# Patient Record
Sex: Female | Born: 1950
Health system: Southern US, Community
[De-identification: ages and names within clinical notes are randomized; demographics above are authoritative.]

## PROBLEM LIST (undated history)

## (undated) DIAGNOSIS — C4491 Basal cell carcinoma of skin, unspecified: Secondary | ICD-10-CM

## (undated) DIAGNOSIS — Z8669 Personal history of other diseases of the nervous system and sense organs: Secondary | ICD-10-CM

## (undated) DIAGNOSIS — R519 Headache, unspecified: Secondary | ICD-10-CM

## (undated) DIAGNOSIS — E039 Hypothyroidism, unspecified: Secondary | ICD-10-CM

## (undated) DIAGNOSIS — M1990 Unspecified osteoarthritis, unspecified site: Secondary | ICD-10-CM

## (undated) DIAGNOSIS — G709 Myoneural disorder, unspecified: Secondary | ICD-10-CM

## (undated) DIAGNOSIS — G47419 Narcolepsy without cataplexy: Secondary | ICD-10-CM

## (undated) DIAGNOSIS — G4710 Hypersomnia, unspecified: Secondary | ICD-10-CM

## (undated) DIAGNOSIS — G20 Parkinson's disease: Secondary | ICD-10-CM

## (undated) DIAGNOSIS — G20A1 Parkinson's disease without dyskinesia, without mention of fluctuations: Secondary | ICD-10-CM

## (undated) DIAGNOSIS — R400 Somnolence: Secondary | ICD-10-CM

## (undated) HISTORY — DX: Somnolence: R40.0

## (undated) HISTORY — DX: Basal cell carcinoma of skin, unspecified: C44.91

## (undated) HISTORY — DX: Hypersomnia, unspecified: G47.10

## (undated) HISTORY — DX: Narcolepsy without cataplexy: G47.419

## (undated) HISTORY — DX: Parkinson's disease without dyskinesia, without mention of fluctuations: G20.A1

## (undated) HISTORY — DX: Hypothyroidism, unspecified: E03.9

## (undated) HISTORY — DX: Parkinson's disease: G20

## (undated) HISTORY — PX: CATARACT EXTRACTION: SUR2

## (undated) HISTORY — DX: Personal history of other diseases of the nervous system and sense organs: Z86.69

---

## 1999-06-05 ENCOUNTER — Other Ambulatory Visit: Admission: RE | Admit: 1999-06-05 | Discharge: 1999-06-05 | Payer: Self-pay | Admitting: Obstetrics and Gynecology

## 2000-08-07 ENCOUNTER — Encounter: Payer: Self-pay | Admitting: Obstetrics and Gynecology

## 2000-08-07 ENCOUNTER — Encounter: Admission: RE | Admit: 2000-08-07 | Discharge: 2000-08-07 | Payer: Self-pay | Admitting: Obstetrics and Gynecology

## 2000-08-08 ENCOUNTER — Other Ambulatory Visit: Admission: RE | Admit: 2000-08-08 | Discharge: 2000-08-08 | Payer: Self-pay | Admitting: Obstetrics and Gynecology

## 2001-11-25 ENCOUNTER — Encounter: Admission: RE | Admit: 2001-11-25 | Discharge: 2001-11-25 | Payer: Self-pay | Admitting: Family Medicine

## 2001-11-25 ENCOUNTER — Other Ambulatory Visit: Admission: RE | Admit: 2001-11-25 | Discharge: 2001-11-25 | Payer: Self-pay | Admitting: *Deleted

## 2001-11-25 ENCOUNTER — Encounter: Payer: Self-pay | Admitting: Obstetrics and Gynecology

## 2003-06-15 ENCOUNTER — Encounter: Payer: Self-pay | Admitting: Obstetrics and Gynecology

## 2003-06-15 ENCOUNTER — Encounter: Admission: RE | Admit: 2003-06-15 | Discharge: 2003-06-15 | Payer: Self-pay | Admitting: Obstetrics and Gynecology

## 2004-07-04 ENCOUNTER — Other Ambulatory Visit: Admission: RE | Admit: 2004-07-04 | Discharge: 2004-07-04 | Payer: Self-pay | Admitting: Obstetrics and Gynecology

## 2004-07-18 ENCOUNTER — Ambulatory Visit (HOSPITAL_COMMUNITY): Admission: RE | Admit: 2004-07-18 | Discharge: 2004-07-18 | Payer: Self-pay | Admitting: Obstetrics and Gynecology

## 2005-09-07 ENCOUNTER — Other Ambulatory Visit: Admission: RE | Admit: 2005-09-07 | Discharge: 2005-09-07 | Payer: Self-pay | Admitting: Obstetrics and Gynecology

## 2005-09-26 ENCOUNTER — Ambulatory Visit (HOSPITAL_COMMUNITY): Admission: RE | Admit: 2005-09-26 | Discharge: 2005-09-26 | Payer: Self-pay | Admitting: Obstetrics and Gynecology

## 2006-10-21 ENCOUNTER — Other Ambulatory Visit: Admission: RE | Admit: 2006-10-21 | Discharge: 2006-10-21 | Payer: Self-pay | Admitting: Obstetrics & Gynecology

## 2006-10-23 ENCOUNTER — Ambulatory Visit (HOSPITAL_COMMUNITY): Admission: RE | Admit: 2006-10-23 | Discharge: 2006-10-23 | Payer: Self-pay | Admitting: Obstetrics and Gynecology

## 2007-12-08 ENCOUNTER — Ambulatory Visit (HOSPITAL_COMMUNITY): Admission: RE | Admit: 2007-12-08 | Discharge: 2007-12-08 | Payer: Self-pay | Admitting: Obstetrics and Gynecology

## 2008-01-05 ENCOUNTER — Other Ambulatory Visit: Admission: RE | Admit: 2008-01-05 | Discharge: 2008-01-05 | Payer: Self-pay | Admitting: Obstetrics and Gynecology

## 2008-01-26 ENCOUNTER — Encounter: Admission: RE | Admit: 2008-01-26 | Discharge: 2008-01-26 | Payer: Self-pay | Admitting: Obstetrics & Gynecology

## 2008-12-23 ENCOUNTER — Ambulatory Visit (HOSPITAL_COMMUNITY): Admission: RE | Admit: 2008-12-23 | Discharge: 2008-12-23 | Payer: Self-pay | Admitting: Obstetrics & Gynecology

## 2009-01-11 ENCOUNTER — Other Ambulatory Visit: Admission: RE | Admit: 2009-01-11 | Discharge: 2009-01-11 | Payer: Self-pay | Admitting: Obstetrics and Gynecology

## 2009-10-04 ENCOUNTER — Ambulatory Visit: Payer: Self-pay | Admitting: Sports Medicine

## 2009-10-04 DIAGNOSIS — M7750 Other enthesopathy of unspecified foot: Secondary | ICD-10-CM | POA: Insufficient documentation

## 2009-10-04 DIAGNOSIS — M79609 Pain in unspecified limb: Secondary | ICD-10-CM | POA: Insufficient documentation

## 2010-01-19 ENCOUNTER — Ambulatory Visit (HOSPITAL_COMMUNITY): Admission: RE | Admit: 2010-01-19 | Discharge: 2010-01-19 | Payer: Self-pay | Admitting: Obstetrics & Gynecology

## 2010-12-14 ENCOUNTER — Ambulatory Visit
Admission: RE | Admit: 2010-12-14 | Discharge: 2010-12-14 | Payer: Self-pay | Source: Home / Self Care | Attending: Sports Medicine | Admitting: Sports Medicine

## 2010-12-21 NOTE — Assessment & Plan Note (Signed)
Summary: ORTHOTICS PER [facility]   Vital Signs:  Patient profile:   60 year old female Pulse rate:   70 / minute BP sitting:   118 / 82  (right arm)  Vitals Entered By: Rochele Pages RN (December 14, 2010 10:11 AM) CC: orthotics   CC:  orthotics.  History of Present Illness: 60yo female to office for custom orthotics.  Hx of metatarsalgia, been having increasing pain in left foot.  Denies any swelling.  Occasional numbness/tingling on top of foot.  Wearing Sports Insoles with MT pad on left insole - was helpful initially, but not as helpful now that worn down.  She is active with aerobics, hiking, & walking on a regular basis.    Preventive Screening-Counseling & Management  Alcohol-Tobacco     Smoking Status: never  Allergies: No Known Drug Allergies  Social History: Smoking Status:  never  Review of Systems       per HPI  Physical Exam  General:  Well-developed,well-nourished,in no acute distress; alert,appropriate and cooperative throughout examination Msk:  HIPS: FROM b/l without pain.  Weakness on hip abduction bilat L > R, otherwise good strength.  ANKLES: FROM b/l without pain, weakness, or laxity.  FEET: cavus foot b/l, mild transverse arch collapse on left.  Callous noted at MT heads 2-4 on left, no significant callous on right.  TTP over MT heads 2-4 on left.  No tenderness on the right.  GAIT: no leg length difference.  no limp, noted to have wobble of hips & knees - more wobble on the left. Pulses:  +2/4 DP & PT b/l Neurologic:  sensation intact to light touch.     Impression & Recommendations:  Problem # 1:  FOOT PAIN, LEFT (ICD-729.5)  - L foot pain seconary to metatarsalgia - Fitted with custom orthotics in office today Patient was fitted for a : standard, cushioned, semi-rigid orthotic. The orthotic was heated and afterward the patient stood on the orthotic blank positioned on the orthotic stand. The patient was positioned in subtalar neutral  position and 10 degrees of ankle dorsiflexion in a weight bearing stance. After completion of molding, a stable base was applied to the orthotic blank. The blank was ground to a stable position for weight bearing. Size: 7 - blue swirl Base: blue EVA Posting: none Additional orthotic padding: MT pad on left insole Orthotics comfortable in office today & gait was neutral with orthotics 45-mins spent with orthotic preparation - Wear insoles in shoes for aerobics & hiking - Shown exercises to help improve hip strength - f/u as needed  Orders: Orthotic Materials, each unit (L3002) Metatarsal Pads (F6213)  Problem # 2:  METATARSALGIA (ICD-726.70)  - Metarsalgia of left foot.  Fitted with custom orthotics as stated above - MT pads on left sports insole replaced with new ones today - f/u as needed   Orders: Orthotic Materials, each unit (L3002) Metatarsal Pads (Y8657)   Orders Added: 1)  Est. Patient Level IV [84696] 2)  Orthotic Materials, each unit [L3002] 3)  Metatarsal Pads [E9528]

## 2011-04-05 ENCOUNTER — Other Ambulatory Visit: Payer: Self-pay | Admitting: Obstetrics & Gynecology

## 2011-04-05 DIAGNOSIS — Z1231 Encounter for screening mammogram for malignant neoplasm of breast: Secondary | ICD-10-CM

## 2011-04-17 ENCOUNTER — Ambulatory Visit (HOSPITAL_COMMUNITY)
Admission: RE | Admit: 2011-04-17 | Discharge: 2011-04-17 | Disposition: A | Discharge status: Home / Self Care | Payer: PRIVATE HEALTH INSURANCE | Source: Ambulatory Visit | Attending: Obstetrics & Gynecology | Admitting: Obstetrics & Gynecology

## 2011-04-17 DIAGNOSIS — Z1231 Encounter for screening mammogram for malignant neoplasm of breast: Secondary | ICD-10-CM | POA: Insufficient documentation

## 2011-10-24 ENCOUNTER — Ambulatory Visit: Payer: PRIVATE HEALTH INSURANCE | Admitting: Sports Medicine

## 2011-10-25 ENCOUNTER — Encounter: Payer: Self-pay | Admitting: Sports Medicine

## 2011-10-25 ENCOUNTER — Ambulatory Visit (INDEPENDENT_AMBULATORY_CARE_PROVIDER_SITE_OTHER): Payer: PRIVATE HEALTH INSURANCE | Admitting: Sports Medicine

## 2011-10-25 VITALS — BP 113/80 | HR 67 | Ht 65.0 in | Wt 124.0 lb

## 2011-10-25 DIAGNOSIS — M7750 Other enthesopathy of unspecified foot: Secondary | ICD-10-CM

## 2011-10-25 DIAGNOSIS — M79609 Pain in unspecified limb: Secondary | ICD-10-CM

## 2011-10-25 NOTE — Patient Instructions (Signed)
Before performing balance exercises, stand on one foot with eyes closed for 15 seconds  Perform 2 sets(10 reps each) of hip abduction and rotation exercises daily  Hold dumbbells while walking across a room on your toes, and walk back on your heels 10 times daily  Do 15 calf raises with your leg straight, and 15 calf raises bent at the knee  Perform 5 cone touches (bend knees 20 degrees and rotate to touch cone) daily  Continue hip lifts as prescribed earlier  RTC as needed

## 2011-10-25 NOTE — Assessment & Plan Note (Signed)
With foot pain less she feels like she can do more strength work for her left leg  Work will focus on strength and balance  See if seh progresses well Reck with me prn

## 2011-10-25 NOTE — Progress Notes (Signed)
  Subjective:    Patient ID: Sarah Montoya, female    DOB: 1951/07/03, 60 y.o.   MRN: [id number]  HPI  Pt presents to clinic for evaluation of left leg weakness. Patient thinks this related to the compensating for the metatarsalgia she had on the lt side for years. Lt metatarsalgia has resolved with orthotic correction. Pt states she notices that her balance and coordination are not as good on lt, and sometimes she shuffles on the left when she walks.  Does 1 to 1.5 hrs exercise daily Aerobics and walking, yoga   Review of Systems     Objective:   Physical Exam GEN: alert, awake, no acute distress CHEST: symmetric chest expansion, normal WOB Skin: no apparent skin lesions or breakdown Musculoskeletal:  - Bilateral quad strength 5/5 - Bilateral hamstring strength 5/5 - Calf stregnth 5/5 on right side, 4/5 on left side  - Less dorsiflexion on heel walk on left  - Bilateral gluteus medius strength of 4/5        Assessment & Plan:  Metatarsalgia - apparently resolved, continue with previous prescribed regimen  Balance issues: likely from decreased muscle mass on left side secondary to decreased activity from chronic metatarsalgia, decreased proprioceptive sensation secondary to age/muscle condition, and change in visual acuity experienced with age - Exercise regimen as below Before performing balance exercises, stand on one foot with eyes closed for 15 seconds Perform 2 sets(10 reps each) of hip abduction and rotation exercises daily Hold dumbbells while walking across a room on your toes, and walk back on your heels 10 times daily Do 15 calf raises with your leg straight, and 15 calf raises bent at the knee Perform 5 cone touches (bend knees 20 degrees and rotate to touch cone) daily Continue hip lifts as prescribed earlier

## 2011-10-25 NOTE — Assessment & Plan Note (Signed)
Continue with metatarsal pads indefinitely. We will help her place the use if needed.

## 2012-06-11 ENCOUNTER — Other Ambulatory Visit: Payer: Self-pay | Admitting: Obstetrics & Gynecology

## 2012-06-11 DIAGNOSIS — Z1231 Encounter for screening mammogram for malignant neoplasm of breast: Secondary | ICD-10-CM

## 2012-06-14 ENCOUNTER — Ambulatory Visit (INDEPENDENT_AMBULATORY_CARE_PROVIDER_SITE_OTHER): Payer: BC Managed Care – PPO | Admitting: Emergency Medicine

## 2012-06-14 VITALS — BP 123/81 | HR 68 | Temp 97.9°F | Resp 16 | Ht 65.0 in | Wt 122.0 lb

## 2012-06-14 DIAGNOSIS — L0390 Cellulitis, unspecified: Secondary | ICD-10-CM

## 2012-06-14 DIAGNOSIS — L0291 Cutaneous abscess, unspecified: Secondary | ICD-10-CM

## 2012-06-14 MED ORDER — SULFAMETHOXAZOLE-TRIMETHOPRIM 800-160 MG PO TABS: 1.0000 | ORAL_TABLET | Freq: Two times a day (BID) | ORAL | Status: AC

## 2012-06-14 NOTE — Progress Notes (Signed)
   Date:  06/14/2012   Name:  Sarah Montoya   DOB:  [birth year]   MRN:  [id number]  PCP:  No primary provider on file.    Chief Complaint: Toe Pain   History of Present Illness:  Nyeisha Goodall is a 61 y.o. very pleasant female patient who presents with the following:  Red painful left great toe.  Swollen, lymphangitis after getting a pedicure  Patient Active Problem List  Diagnosis  . METATARSALGIA  . FOOT PAIN, LEFT    No past medical history on file.  No past surgical history on file.  History  Substance Use Topics  . Smoking status: Never Smoker   . Smokeless tobacco: Never Used  . Alcohol Use: Not on file    No family history on file.  No Known Allergies  Medication list has been reviewed and updated.  Current Outpatient Prescriptions on File Prior to Visit  Medication Sig Dispense Refill  . levothyroxine (SYNTHROID, LEVOTHROID) 100 MCG tablet Take 100 mcg by mouth daily.        . modafinil (PROVIGIL) 100 MG tablet Take 100 mg by mouth 2 (two) times daily.          Review of Systems:  As per HPI, otherwise negative.    Physical Examination: Filed Vitals:   06/14/12 1053  BP: 123/81  Pulse: 68  Temp: 97.9 F (36.6 C)  Resp: 16   Filed Vitals:   06/14/12 1053  Height: 5\' 5"  (1.651 m)  Weight: 122 lb (55.339 kg)   Body mass index is 20.30 kg/(m^2). Ideal Body Weight: Weight in (lb) to have BMI = 25: 149.9    GEN: WDWN, NAD, Non-toxic, Alert & Oriented x 3 HEENT: Atraumatic, Normocephalic.  Ears and Nose: No external deformity. EXTR: No clubbing/cyanosis/edema NEURO: Normal gait.  PSYCH: Normally interactive. Conversant. Not depressed or anxious appearing.  Calm demeanor.  Foot:  Red swollen toe with lymphangitis.  Blister on medial base of great toe.  Assessment and Plan: Cellulitis Elevate Local heat Septra Follow up as needed  Carmelina Dane, MD

## 2012-06-14 NOTE — Patient Instructions (Addendum)

## 2012-06-30 ENCOUNTER — Ambulatory Visit (HOSPITAL_COMMUNITY)
Admission: RE | Admit: 2012-06-30 | Discharge: 2012-06-30 | Disposition: A | Discharge status: Home / Self Care | Payer: BC Managed Care – PPO | Source: Ambulatory Visit | Attending: Obstetrics & Gynecology | Admitting: Obstetrics & Gynecology

## 2012-06-30 DIAGNOSIS — Z1231 Encounter for screening mammogram for malignant neoplasm of breast: Secondary | ICD-10-CM | POA: Insufficient documentation

## 2012-08-12 ENCOUNTER — Ambulatory Visit (INDEPENDENT_AMBULATORY_CARE_PROVIDER_SITE_OTHER): Payer: BC Managed Care – PPO | Admitting: Sports Medicine

## 2012-08-12 ENCOUNTER — Encounter: Payer: Self-pay | Admitting: Sports Medicine

## 2012-08-12 VITALS — BP 120/81 | HR 79 | Ht 65.0 in | Wt 122.0 lb

## 2012-08-12 DIAGNOSIS — R269 Unspecified abnormalities of gait and mobility: Secondary | ICD-10-CM

## 2012-08-12 DIAGNOSIS — M7750 Other enthesopathy of unspecified foot: Secondary | ICD-10-CM

## 2012-08-12 DIAGNOSIS — M79609 Pain in unspecified limb: Secondary | ICD-10-CM

## 2012-08-12 NOTE — Progress Notes (Signed)
  [facility]  Patient name: Sarah Montoya MRN [id number]  Date of birth: 1951-01-30  CC & HPI:  Sarah Montoya is a 61 y.o. female presenting today for evaluation of gait.  She was previously seen for left leg weakness and left metatarsalgia.  Continues to exercise on a regular basis but reports occasional left foot paresthesia over 2nd and 3rd toes during walking.  Also reports people frequently asking her "what she did to her foot" because she is limping.  Denies pain at her foot and denies self awareness of limping.  Overall improvement in her symptoms.  Only wearing previously prescribed metatarsal cushion on Left foot on insole provided with shoe, self placed and unsure if in appropriate place.  ROS:  No falls, no weakness  Pertinent History Reviewed:  Medical & Surgical Hx:  Reviewed: Significant for no hypothyroidism Medications: Reviewed & Updated - see associated section Social History: Reviewed - Significant for non smoker, active lifestyle  Objective Findings:  Vitals:  Filed Vitals:   08/12/12 1513  BP: 120/81  Pulse: 79    PE: GENERAL:  Adult caucasian thin female. In no discomfort; no respiratory distress. Foot Exam: longitudinal and Transverse arch collapse B with splay toeing between 2nd and 3rd toes B.  Head of 5th callus on R with morton's callus B.  Neutral calcaneous with good posterior tibialis recruitment Leg length: equal SI Joint Mobility: open bilaterally Gait Evaluation: R sided ankle inversion/excessive supination shift during transition from midstance to toe-off. R foot slightly externally rotated.     Assessment & Plan:

## 2012-08-13 DIAGNOSIS — R269 Unspecified abnormalities of gait and mobility: Secondary | ICD-10-CM | POA: Insufficient documentation

## 2012-08-13 NOTE — Assessment & Plan Note (Signed)
We replaced MT pads on orthotics and we want her to use this more consistently.

## 2012-08-13 NOTE — Assessment & Plan Note (Signed)
Excessive supination prior to correction.  Metarsal cushion added to to prior custom orthotics for improved transverse arch support.  Positioned appropriately.  Pt then observed walking in cushioned shoe (aerobics (walking) shoe) with correction of excessive pronation.  Pt has been wearing motion control shoe for walking and was having comfort issues with custom orthotics in this shoe.  Encouraged to wear/purchase neutral cushioning shoes in future and to allow orthotic to correct motion.  Pt voices understanding and appreciative of plan.

## 2012-08-13 NOTE — Assessment & Plan Note (Signed)
Pt with reported paraesthesia during walking.  Likely due to misplaced metatarsal cushion (pt reports too far distal).  Other possibility is a laces paraesthesia.  Will try appropriate placement of cushioning.  If symptoms continue with walking pt will try to loosen proximal laces as she does report taking her shoes off after walking does usually alleviate her symptoms.    Pt to follow up in 1 year for re-evaluation of integrity of custom orthotics or PRN for return/persistence of symptoms.

## 2013-02-04 ENCOUNTER — Ambulatory Visit (INDEPENDENT_AMBULATORY_CARE_PROVIDER_SITE_OTHER): Payer: BC Managed Care – PPO | Admitting: Sports Medicine

## 2013-02-04 VITALS — BP 116/66 | Ht 65.0 in | Wt 121.0 lb

## 2013-02-04 DIAGNOSIS — M7550 Bursitis of unspecified shoulder: Secondary | ICD-10-CM | POA: Insufficient documentation

## 2013-02-04 DIAGNOSIS — M7750 Other enthesopathy of unspecified foot: Secondary | ICD-10-CM

## 2013-02-04 DIAGNOSIS — IMO0002 Reserved for concepts with insufficient information to code with codable children: Secondary | ICD-10-CM

## 2013-02-04 DIAGNOSIS — M75100 Unspecified rotator cuff tear or rupture of unspecified shoulder, not specified as traumatic: Secondary | ICD-10-CM

## 2013-02-04 DIAGNOSIS — M25512 Pain in left shoulder: Secondary | ICD-10-CM

## 2013-02-04 DIAGNOSIS — M25519 Pain in unspecified shoulder: Secondary | ICD-10-CM

## 2013-02-04 DIAGNOSIS — M7552 Bursitis of left shoulder: Secondary | ICD-10-CM

## 2013-02-04 NOTE — Progress Notes (Signed)
Chief complaint: Metatarsalgia as well as left shoulder pain  History of present illness: The patient is a 62 year old female who is coming back for followup of her metatarsalgia. Patient was given custom orthotics and has been wearing them for quite some time. Patient states that she has been feeling very well in them but unfortunately recently she started having more forefoot pain. Patient has been doing more aerobic classes and states on the ball of her feet more she thinks this is contributing. Patient has changing shoes over the course of time. Patient denies any new symptoms such as numbness or tingling or any type of swelling. Patient does not remember any traumatic injury that. Patient denies any nighttime awakening.  Secondly she's had some left shoulder pain for approximately 2 months. Patient does not remember a traumatic event. Patient describes that she does have some discomfort with certain movements. Patient states she doesn't move it much when she walks secondary to the discomfort. Patient has only tried some anti-inflammatories which does make some minimal benefit. Patient has not started doing the exercises probably willing to do physical therapy if we thought that this is of benefit. Patient denies any weakness, radiation of pain, any numbness or tingling.  Past medical history, social, surgical and family history all reviewed.   Physical exam Blood pressure 116/66, height 5\' 5"  (1.651 m), weight 121 lb (54.885 kg). General: No apparent distress alert and oriented x3 mood and affect normal Left shoulder exam: On inspection there is no gross deformity. She is neurovascularly intact distally. Patient has full active range of motion. She does have 5 out of 5 strength of the rotator cuff. She does have some mild positive primary impingement signs. She is mildly tender to palpation in the subacromial space. Foot Exam: longitudinal and Transverse arch collapse with with splay toeing between  2nd and 3rd toes. Hindfoot neutral.

## 2013-02-04 NOTE — Assessment & Plan Note (Signed)
Patient does have metatarsalgia and I do think that her custom insoles seem to be doing well. Patient was fitted with extra padding over the ball of the foot bilaterally he did have decreased amount of pressure on the metatarsals. Patient is going to try this transition and see if this does help. Patient can come back again in 6 weeks if we are not making significant improvement he'll see if there's any other adjustments that can occur. Encourage her to do more of her hip abduction exercises as well as increasing her proprioception of her ankles. If doing well she will followup on an as-needed basis.

## 2013-02-04 NOTE — Assessment & Plan Note (Signed)
Patient's clinical exam is consistent with a potential subacromial bursitis. Patient the conservative therapy and would like to go to formal physical therapy. Patient was given a home exercise program with theraband which she will do and we'll see formal physical therapy. Encourage patient to potentially icing as well as over-the-counter anti-inflammatories as needed  Patient will come back again in 6 weeks for further evaluation.

## 2013-04-07 ENCOUNTER — Ambulatory Visit: Payer: BC Managed Care – PPO | Attending: Sports Medicine | Admitting: Physical Therapy

## 2013-04-07 DIAGNOSIS — R269 Unspecified abnormalities of gait and mobility: Secondary | ICD-10-CM | POA: Insufficient documentation

## 2013-04-07 DIAGNOSIS — IMO0001 Reserved for inherently not codable concepts without codable children: Secondary | ICD-10-CM | POA: Insufficient documentation

## 2013-04-07 DIAGNOSIS — M6281 Muscle weakness (generalized): Secondary | ICD-10-CM | POA: Insufficient documentation

## 2013-04-07 DIAGNOSIS — M2560 Stiffness of unspecified joint, not elsewhere classified: Secondary | ICD-10-CM | POA: Insufficient documentation

## 2013-04-07 DIAGNOSIS — M25519 Pain in unspecified shoulder: Secondary | ICD-10-CM | POA: Insufficient documentation

## 2013-04-14 ENCOUNTER — Ambulatory Visit: Payer: BC Managed Care – PPO | Admitting: Physical Therapy

## 2013-04-15 ENCOUNTER — Other Ambulatory Visit: Payer: Self-pay

## 2013-04-15 NOTE — Telephone Encounter (Signed)
Patient called requesting a refill on Adderall.  She would like it mailed to her when ready.  Call back number with any questions is [phone].  Patient has an appt scheduled in August

## 2013-04-16 MED ORDER — AMPHETAMINE-DEXTROAMPHETAMINE 10 MG PO TABS: 5.0000 mg | ORAL_TABLET | Freq: Every day | ORAL | Status: DC

## 2013-04-21 ENCOUNTER — Encounter: Payer: BC Managed Care – PPO | Admitting: Physical Therapy

## 2013-05-05 ENCOUNTER — Ambulatory Visit: Payer: BC Managed Care – PPO | Attending: Sports Medicine | Admitting: Physical Therapy

## 2013-05-05 DIAGNOSIS — M25519 Pain in unspecified shoulder: Secondary | ICD-10-CM | POA: Insufficient documentation

## 2013-05-05 DIAGNOSIS — M6281 Muscle weakness (generalized): Secondary | ICD-10-CM | POA: Insufficient documentation

## 2013-05-05 DIAGNOSIS — M2560 Stiffness of unspecified joint, not elsewhere classified: Secondary | ICD-10-CM | POA: Insufficient documentation

## 2013-05-05 DIAGNOSIS — R269 Unspecified abnormalities of gait and mobility: Secondary | ICD-10-CM | POA: Insufficient documentation

## 2013-05-05 DIAGNOSIS — IMO0001 Reserved for inherently not codable concepts without codable children: Secondary | ICD-10-CM | POA: Insufficient documentation

## 2013-06-10 ENCOUNTER — Encounter: Payer: Self-pay | Admitting: Obstetrics & Gynecology

## 2013-06-15 ENCOUNTER — Ambulatory Visit: Payer: BC Managed Care – PPO | Admitting: Obstetrics & Gynecology

## 2013-06-29 ENCOUNTER — Encounter: Payer: Self-pay | Admitting: Obstetrics & Gynecology

## 2013-06-30 ENCOUNTER — Ambulatory Visit (INDEPENDENT_AMBULATORY_CARE_PROVIDER_SITE_OTHER): Payer: BC Managed Care – PPO | Admitting: Obstetrics & Gynecology

## 2013-06-30 ENCOUNTER — Encounter: Payer: Self-pay | Admitting: Obstetrics & Gynecology

## 2013-06-30 VITALS — BP 122/82 | HR 64 | Resp 16 | Ht 64.75 in | Wt 122.6 lb

## 2013-06-30 DIAGNOSIS — Z01419 Encounter for gynecological examination (general) (routine) without abnormal findings: Secondary | ICD-10-CM

## 2013-06-30 DIAGNOSIS — M8580 Other specified disorders of bone density and structure, unspecified site: Secondary | ICD-10-CM

## 2013-06-30 DIAGNOSIS — M899 Disorder of bone, unspecified: Secondary | ICD-10-CM

## 2013-06-30 DIAGNOSIS — R6882 Decreased libido: Secondary | ICD-10-CM

## 2013-06-30 DIAGNOSIS — Z0000 Encounter for general adult medical examination without abnormal findings: Secondary | ICD-10-CM

## 2013-06-30 LAB — POCT URINALYSIS DIPSTICK
Leukocytes, UA: NEGATIVE
Nitrite, UA: NEGATIVE
Urobilinogen, UA: NEGATIVE

## 2013-06-30 MED ORDER — ESTRADIOL 0.1 MG/GM VA CREA: TOPICAL_CREAM | VAGINAL | Status: DC

## 2013-06-30 MED ORDER — ESTROGENS, CONJUGATED 0.625 MG/GM VA CREA: TOPICAL_CREAM | Freq: Every day | VAGINAL | Status: DC

## 2013-06-30 MED ORDER — CLOBETASOL PROPIONATE 0.05 % EX OINT: TOPICAL_OINTMENT | CUTANEOUS | Status: DC | PRN

## 2013-06-30 MED ORDER — NONFORMULARY OR COMPOUNDED ITEM: Status: DC

## 2013-06-30 MED ORDER — VITAMIN D (ERGOCALCIFEROL) 1.25 MG (50000 UNIT) PO CAPS: 50000.0000 [IU] | ORAL_CAPSULE | ORAL | Status: DC

## 2013-06-30 NOTE — Patient Instructions (Signed)

## 2013-06-30 NOTE — Progress Notes (Signed)
62 y.o. G3P3 MarriedCaucasianF here for annual exam.    Patient's last menstrual period was 07/03/2008.          Sexually active: yes  The current method of family planning is none.    Exercising: yes  aerobics Smoker:  no  Health Maintenance: Pap:  06/12/12 WNL/negative HR HPV History of abnormal Pap:  no MMG:  06/30/12 normal Colonoscopy:  2007 repeat in 10 years, Dr. Matthias Hughs BMD:   3/09 TDaP:  06/12/12 Screening Labs: with Dr. Kevan Ny, Hb today: 14.1, Urine today: negative   reports that she has never smoked. She has never used smokeless tobacco. She reports that she drinks about 1.0 ounces of alcohol per week. She reports that she does not use illicit drugs.  Past Medical History  Diagnosis Date  . Hypothyroidism   . Narcolepsy   . Basal cell carcinoma   . Hx of migraines     History reviewed. No pertinent past surgical history.  Current Outpatient Prescriptions  Medication Sig Dispense Refill  . amphetamine-dextroamphetamine (ADDERALL) 10 MG tablet Take 0.5-1 tablets (5-10 mg total) by mouth daily.  90 tablet  0  . clobetasol ointment (TEMOVATE) 0.05 % Apply topically as needed.      Marland Kitchen ESTRACE VAGINAL 0.1 MG/GM vaginal cream Apply topically once a week.      . Lactase (LACTAID PO) Take by mouth as needed.      Marland Kitchen levothyroxine (SYNTHROID, LEVOTHROID) 88 MCG tablet Take 88 mcg by mouth daily before breakfast.      . SUMAtriptan (IMITREX) 100 MG tablet 100 mg as needed.      . Testosterone Propionate 2 % OINT Place onto the skin 2 (two) times a week.      . Vitamin D, Ergocalciferol, (DRISDOL) 50000 UNITS CAPS capsule Take 50,000 Units by mouth every 7 (seven) days.       No current facility-administered medications for this visit.    Family History  Problem Relation Age of Onset  . Breast cancer Mother 47  . Prostate cancer Father   . Thyroid disease Father   . Osteoporosis Mother   . Mental retardation Sister     ROS:  Pertinent items are noted in HPI.  Otherwise, a  comprehensive ROS was negative.  Exam:   BP 122/82  Pulse 64  Resp 16  Ht 5' 4.75" (1.645 m)  Wt 122 lb 9.6 oz (55.611 kg)  BMI 20.55 kg/m2  LMP 07/03/2008  Weight change: +1lb   Height: 5' 4.75" (164.5 cm)  Ht Readings from Last 3 Encounters:  06/30/13 5' 4.75" (1.645 m)  02/04/13 5\' 5"  (1.651 m)  08/12/12 5\' 5"  (1.651 m)    General appearance: alert, cooperative and appears stated age Head: Normocephalic, without obvious abnormality, atraumatic Neck: no adenopathy, supple, symmetrical, trachea midline and thyroid normal to inspection and palpation Lungs: clear to auscultation bilaterally Breasts: normal appearance, no masses or tenderness Heart: regular rate and rhythm Abdomen: soft, non-tender; bowel sounds normal; no masses,  no organomegaly Extremities: extremities normal, atraumatic, no cyanosis or edema Skin: Skin color, texture, turgor normal. No rashes or lesions Lymph nodes: Cervical, supraclavicular, and axillary nodes normal. No abnormal inguinal nodes palpated Neurologic: Grossly normal   Pelvic: External genitalia:  no lesions              Urethra:  normal appearing urethra with no masses, tenderness or lesions              Bartholins and Skenes: normal  Vagina: normal appearing vagina with normal color and discharge, no lesions              Cervix: no lesions              Pap taken: no Bimanual Exam:  Uterus:  normal size, contour, position, consistency, mobility, non-tender              Adnexa: normal adnexa and no mass, fullness, tenderness               Rectovaginal: Confirms               Anus:  normal sphincter tone, no lesions  A:  Well Woman with normal exam PMP, No HRT H/O LS&A Decreased libido Hypothyroidism Low Vit D  P:   Mammogram yearly.  Patient just got reminder card pap smear BMD.  Order for [facility] placed.   TSH, Vit D, total testosterone today Orders for Vit D and Clobetasol and Premarin cream to  pharmacy Orders for topical testosterone to [facility] return annually or prn  An After Visit Summary was printed and given to the patient.

## 2013-06-30 NOTE — Addendum Note (Signed)
Addended by: Jerene Bears on: 06/30/2013 02:33 PM   Modules accepted: Orders

## 2013-07-01 ENCOUNTER — Ambulatory Visit: Payer: BC Managed Care – PPO | Admitting: Neurology

## 2013-07-01 LAB — TESTOSTERONE: Testosterone: 421 ng/dL — ABNORMAL HIGH (ref 10–70)

## 2013-07-01 NOTE — Addendum Note (Signed)
Addended by: Jerene Bears on: 07/01/2013 02:01 PM   Modules accepted: Orders

## 2013-07-02 ENCOUNTER — Encounter: Payer: Self-pay | Admitting: Obstetrics & Gynecology

## 2013-07-03 ENCOUNTER — Telehealth: Payer: Self-pay

## 2013-07-03 NOTE — Telephone Encounter (Signed)
8/15 lmtcb

## 2013-07-03 NOTE — Telephone Encounter (Signed)
Patient aware of all results. 

## 2013-07-03 NOTE — Telephone Encounter (Signed)
Returning your phone call.

## 2013-07-03 NOTE — Telephone Encounter (Signed)
Message copied by Elisha Headland on Fri Jul 03, 2013  9:20 AM ------      Message from: Jerene Bears      Created: Wed Jul 01, 2013  1:58 PM       Please inform Vit D good.  Rx was sent to pharmacy.  Take either one every two weeks or 2000 IU daily.  TSH normal.  Continue current thyroid dose.  I will sent to PCP.  Testosterone level is very high.  She needs to stop the topical and repeat level in six weeks to make sure normal.  Can restart then but less frequently.  Order placed. ------

## 2013-07-21 ENCOUNTER — Other Ambulatory Visit: Payer: Self-pay | Admitting: Obstetrics & Gynecology

## 2013-07-21 DIAGNOSIS — Z1231 Encounter for screening mammogram for malignant neoplasm of breast: Secondary | ICD-10-CM

## 2013-07-26 ENCOUNTER — Other Ambulatory Visit: Payer: Self-pay | Admitting: Neurology

## 2013-07-28 ENCOUNTER — Ambulatory Visit (HOSPITAL_COMMUNITY)
Admission: RE | Admit: 2013-07-28 | Discharge: 2013-07-28 | Disposition: A | Discharge status: Home / Self Care | Payer: BC Managed Care – PPO | Source: Ambulatory Visit | Attending: Obstetrics & Gynecology | Admitting: Obstetrics & Gynecology

## 2013-07-28 ENCOUNTER — Other Ambulatory Visit: Payer: Self-pay

## 2013-07-28 DIAGNOSIS — M8580 Other specified disorders of bone density and structure, unspecified site: Secondary | ICD-10-CM

## 2013-07-28 DIAGNOSIS — Z13820 Encounter for screening for osteoporosis: Secondary | ICD-10-CM | POA: Insufficient documentation

## 2013-07-28 DIAGNOSIS — Z780 Asymptomatic menopausal state: Secondary | ICD-10-CM | POA: Insufficient documentation

## 2013-07-28 DIAGNOSIS — Z1231 Encounter for screening mammogram for malignant neoplasm of breast: Secondary | ICD-10-CM | POA: Insufficient documentation

## 2013-07-28 MED ORDER — AMPHETAMINE-DEXTROAMPHETAMINE 10 MG PO TABS: 5.0000 mg | ORAL_TABLET | Freq: Every day | ORAL | Status: DC

## 2013-07-28 NOTE — Telephone Encounter (Signed)
Rx signed and mailed.

## 2013-08-11 ENCOUNTER — Encounter: Payer: Self-pay | Admitting: Obstetrics & Gynecology

## 2013-08-24 ENCOUNTER — Encounter: Payer: Self-pay | Admitting: *Deleted

## 2013-08-24 ENCOUNTER — Other Ambulatory Visit: Payer: BC Managed Care – PPO

## 2013-08-25 ENCOUNTER — Other Ambulatory Visit (INDEPENDENT_AMBULATORY_CARE_PROVIDER_SITE_OTHER): Payer: BC Managed Care – PPO

## 2013-08-25 DIAGNOSIS — R6882 Decreased libido: Secondary | ICD-10-CM

## 2013-08-26 ENCOUNTER — Ambulatory Visit (INDEPENDENT_AMBULATORY_CARE_PROVIDER_SITE_OTHER): Payer: BC Managed Care – PPO | Admitting: Neurology

## 2013-08-26 ENCOUNTER — Encounter: Payer: Self-pay | Admitting: Neurology

## 2013-08-26 VITALS — BP 109/81 | HR 70 | Resp 16 | Ht 66.0 in | Wt 124.0 lb

## 2013-08-26 DIAGNOSIS — G4710 Hypersomnia, unspecified: Secondary | ICD-10-CM

## 2013-08-26 HISTORY — DX: Hypersomnia, unspecified: G47.10

## 2013-08-26 LAB — TESTOSTERONE: Testosterone: 32 ng/dL (ref 10–70)

## 2013-08-26 MED ORDER — AMPHETAMINE-DEXTROAMPHETAMINE 10 MG PO TABS: 10.0000 mg | ORAL_TABLET | Freq: Every day | ORAL | Status: DC

## 2013-08-26 NOTE — Patient Instructions (Signed)
Hypersomnia Hypersomnia usually brings recurrent episodes of excessive daytime sleepiness or prolonged nighttime sleep. It is different than feeling tired due to lack of or interrupted sleep at night. People with hypersomnia are compelled to nap repeatedly during the day. This is often at inappropriate times such as:  At work.  During a meal.  In conversation. These daytime naps usually provide no relief. This disorder typically affects adolescents and young adults. CAUSES  This condition may be caused by:  Another sleep disorder (such as narcolepsy or sleep apnea).  Dysfunction of the autonomic nervous system.  Drug or alcohol abuse.  A physical problem, such as:  A tumor.  Head trauma. This is damage caused by an accident.  Injury to the central nervous system.  Certain medications, or medicine withdrawal.  Medical conditions may contribute to the disorder, including:  Multiple sclerosis.  Depression.  Encephalitis.  Epilepsy.  Obesity.  Some people appear to have a genetic predisposition to this disorder. In others, there is no known cause. SYMPTOMS   Patients often have difficulty waking from a long sleep. They may feel dazed or confused.  Other symptoms may include:  Anxiety.  Increased irritation (inflammation).  Decreased energy.  Restlessness.  Slow thinking.  Slow speech.  Loss of appetite.  Hallucinations.  Memory difficulty.  Tremors, Tics.  Some patients lose the ability to function in family, social, occupational, or other settings. TREATMENT  Treatment is symptomatic in nature. Stimulants and other drugs may be used to treat this disorder. Changes in behavior may help. For example, avoid night work and social activities that delay bed time. Changes in diet may offer some relief. Patients should avoid alcohol and caffeine. PROGNOSIS  The likely outcome (prognosis) for persons with hypersomnia depends on the cause of the disorder.  The disorder itself is not life threatening. But it can have serious consequences. For example, automobile accidents can be caused by falling asleep while driving. The attacks usually continue indefinitely. Document Released: 10/26/2002 Document Revised: 01/28/2012 Document Reviewed: 09/29/2008 [employer] Patient Information 2014 [employer].  

## 2013-08-26 NOTE — Progress Notes (Signed)
[facility]  Provider:  Melvyn Novas, M D  Referring Provider: No ref. provider found Primary Care Physician:  Pearla Dubonnet, MD  Chief Complaint  Patient presents with  . Routine F/U VIisit    Pt has excesive sleepiness Pt has also filled out her ESS at visit    HPI:  Sarah Montoya is a 62 y.o. female  Is seen here as a referral/ revisit  from Dr. Kevan Ny. Study of excessive daytime sleepiness in March 2009 I followed up with her and saw her for excessive daytime sleepiness then she had already taken Provigil at the time I evaluated her but her Epworth scale was endorsed between 0 and to points she had no trouble controlling her urges to sleep on the medication. As her insurance changed there Provigil priors for the patient became prohibitive this is when she changed to Adderall also could not have been my first line agent. Consider that she did so well on Provigil for many years taking orally 100 mg in the morning I do not expect this patient to have a high addiction potential. I also would like to say that Sarah Montoya blood pressure remained low and that for that reason cardiovascular concerns were not as strong in her in spite of her is 62 years of age.  The patient begun in 2012 using Adderall and she takes a half of a 10 mg tablet by mouth daily. One half tablet in the morning one at lunch. She normally doesn't take the second ulcer later than later than 2 PM. Today's for sleepiness score is 4 points, fatigue severity score is 16 points there is no evidence of hypersomnia by the patient is on medication, but she reports noticing a difference and she cannot obtain medication and gauze rating scores would be much higher and in that case. Review of Systems: Out of a complete 14 system review, the patient complains of only the following symptoms, and all other reviewed systems are negative. The patient endorsed no symptoms in her review of systems. She had no recent  hospitalizations surgeries or illnesses and there has been no change in her family medical history.  Refill Patient's medication since Adderall associated with a schedule - I cannot give her refills and people after meals loss or upon request  History   Social History  . Marital Status: Married    Spouse Name: N/A    Number of Children: 3  . Years of Education: college   Occupational History  . instructor     [employer]   Social History Main Topics  . Smoking status: Never Smoker   . Smokeless tobacco: Never Used  . Alcohol Use: 1 - 1.5 oz/week    2-3 drink(s) per week     Comment: 3 glasses of wine weekly  . Drug Use: No  . Sexual Activity: Yes    Partners: Male   Other Topics Concern  . Not on file   Social History Narrative   See previous notes.    Family History  Problem Relation Age of Onset  . Breast cancer Mother 49  . Osteoporosis Mother   . Prostate cancer Father   . Thyroid disease Father   . Mental retardation Sister     Past Medical History  Diagnosis Date  . Hypothyroidism   . Narcolepsy   . Basal cell carcinoma   . Hx of migraines   . Drowsiness     excessive daytime    History reviewed.  No pertinent past surgical history.  Current Outpatient Prescriptions  Medication Sig Dispense Refill  . amphetamine-dextroamphetamine (ADDERALL) 10 MG tablet Take 0.5-1 tablets (5-10 mg total) by mouth daily.  90 tablet  0  . cholecalciferol (VITAMIN D) 1000 UNITS tablet Take 1,000 Units by mouth daily.      . clobetasol ointment (TEMOVATE) 0.05 % Apply topically as needed.  30 g  1  . estradiol (ESTRACE) 0.1 MG/GM vaginal cream 1 gram vaginally up two twice weekly.  42.5 g  1  . Lactase (LACTAID PO) Take by mouth as needed.      Marland Kitchen levothyroxine (SYNTHROID, LEVOTHROID) 88 MCG tablet Take 88 mcg by mouth daily before breakfast.      . SUMAtriptan (IMITREX) 100 MG tablet 100 mg as needed.       No current facility-administered medications for this  visit.    Allergies as of 08/26/2013  . (No Known Allergies)    Vitals: BP 109/81  Pulse 70  Resp 16  Ht 5\' 6"  (1.676 m)  Wt 124 lb (56.246 kg)  BMI 20.02 kg/m2  LMP 07/03/2008 Last Weight:  Wt Readings from Last 1 Encounters:  08/26/13 124 lb (56.246 kg)   Last Height:   Ht Readings from Last 1 Encounters:  08/26/13 5\' 6"  (1.676 m)   BMI  .  Physical exam:  General: The patient is awake, alert and appears not in acute distress. The patient is well groomed. Head: Normocephalic, atraumatic. Neck is supple. Mallampati 2, neck circumference: 13 , no nasal deviation. No surgical changes to upper airway  Cardiovascular:  Regular rate and rhythm , without  murmurs or carotid bruit, and without distended neck veins. Respiratory: Lungs are clear to auscultation. Skin:  Without evidence of edema, or rash Trunk: BMI is normal, with normal  posture.  Neurologic exam : The patient is awake and alert, oriented to place and time.  Memory subjective   described as intact. There is a normal attention span & concentration ability. Speech is fluent without   dysarthria,  Mild dysphonia , no  Aphasia.  Mood and affect are appropriate.  Cranial nerves: Pupils are equal and briskly reactive to light. Funduscopic exam without  evidence of pallor or edema. Extraocular movements  in vertical and horizontal planes intact and without nystagmus. Visual fields by finger perimetry are intact. Hearing to finger rub intact.  Facial sensation intact to fine touch. Facial motor strength is symmetric and tongue and uvula move midline.  Motor exam:   Normal muscle bulk and symmetric normal strength in all extremities. There is a slightly general elevated tone throughout upper and lower extremities, a masked face , but no tremor, no cog -wheeling. .   Sensory:  Fine touch, pinprick and vibration were tested in all extremities. Proprioception is  normal.  Coordination: Rapid alternating movements in the  fingers/hands is tested and appear slowed- but patient repots no trouble typing , button closing. .  Gait and station: Patient walks without assistive device . Strength within normal limits. Stance is stable and normal.  Romberg testing is normal.  Deep tendon reflexes: in the  upper and lower extremities are symmetric and intact. Babinski maneuver response is   downgoing.   Assessment:  After physical and neurologic examination, review of laboratory studies, imaging, neurophysiology testing and pre-existing records, assessment is that of subjective hypersomnia, or hypersomnia of unknown origin , controlled by adderall.   Plan:  Treatment plan and additional workup :refill adderall.  10 mg  tab daily, divided bid.  Patients BP remains low.

## 2013-08-27 ENCOUNTER — Telehealth: Payer: Self-pay

## 2013-08-27 NOTE — Telephone Encounter (Signed)
Message copied by Elisha Headland on Thu Aug 27, 2013  3:16 PM ------      Message from: Jerene Bears      Created: Wed Aug 26, 2013 10:29 AM       Inform level now normal.  Can restart using topical Testosterone 1/4 tsp 1-2 times weekly.  Will recheck at AEX. ------

## 2013-08-27 NOTE — Telephone Encounter (Signed)
Lmtcb//kn 

## 2013-08-27 NOTE — Telephone Encounter (Signed)
Patient is returning Kelly[facility] call.

## 2013-08-28 NOTE — Telephone Encounter (Signed)
Patient is returning Kelly[facility] call.

## 2013-08-31 NOTE — Telephone Encounter (Signed)
Patient is returning [facility] Call

## 2013-09-01 NOTE — Telephone Encounter (Signed)
Pt returning call

## 2013-09-03 NOTE — Telephone Encounter (Signed)
Spoke with patient re: lab work//kn

## 2013-09-24 ENCOUNTER — Other Ambulatory Visit: Payer: Self-pay

## 2013-10-27 ENCOUNTER — Ambulatory Visit (INDEPENDENT_AMBULATORY_CARE_PROVIDER_SITE_OTHER): Payer: BC Managed Care – PPO | Admitting: Sports Medicine

## 2013-10-27 DIAGNOSIS — R269 Unspecified abnormalities of gait and mobility: Secondary | ICD-10-CM

## 2013-10-27 DIAGNOSIS — M7750 Other enthesopathy of unspecified foot: Secondary | ICD-10-CM

## 2013-10-27 DIAGNOSIS — M79609 Pain in unspecified limb: Secondary | ICD-10-CM

## 2013-10-27 NOTE — Patient Instructions (Signed)
Sarah Montoya was given a new pair of orthotics today.  On her gait pattern she gets extreme wear at the forefoot and the metatarsal arches bilaterally.  For a shoe we need the following:  One that will allow her to use the orthotic comfortably  One that has excellent forefoot support as this is where she gets the greatest wear  Perhaps not ready for a 0 drip shoe but she does need only a limited drop from heel to forefoot  Pronation/supination control not an issue with orthotics  Shelda Pal at [facility] could advise

## 2013-10-27 NOTE — Assessment & Plan Note (Signed)
Patient was fitted for a : standard, cushioned, semi-rigid orthotic. The orthotic was heated and afterward the patient stood on the orthotic blank positioned on the orthotic stand. The patient was positioned in subtalar neutral position and 10 degrees of ankle dorsiflexion in a weight bearing stance. After completion of molding, a stable base was applied to the orthotic blank. The blank was ground to a stable position for weight bearing. Size: 7 red EVA Base: Blue EVA Posting: MT pads Additional orthotic padding:  Forefoot foam padding  Time 45 mins  Gait was neutral with control of pronation at completion

## 2013-10-27 NOTE — Progress Notes (Signed)
Patient ID: Sarah Montoya, female   DOB: 1951-05-01, 62 y.o.   MRN: [id number]  Sarah Montoya originally had disabling forefoot pain MT pads helped get rid of forefoot pain She had abnomal gait Custom orthotics from Jan 2012 helped normalize this and she was able to return to activity  Does aerobics 1 hour per class most days Does walking 3 miles  Forefeet painful again so we added padding a few mos ago.. Now returns for new orthotics as getting less support than at first  PExam  Thin NAD  The RT foot is widened and has loss of transverse arch There is not much TTP today at MT heads  LT foot has better transverse arch Still some TTP at distal shaft of 2nd/3rd MT plantar This is relieved with MT padding  Callusing less

## 2013-10-27 NOTE — Assessment & Plan Note (Signed)
Much less pain  Use orthotics as much as possible

## 2013-10-27 NOTE — Assessment & Plan Note (Signed)
Cont MT pads  Lt looks less impressive but pads actually help more on left

## 2013-12-22 ENCOUNTER — Other Ambulatory Visit: Payer: Self-pay | Admitting: *Deleted

## 2013-12-22 NOTE — Telephone Encounter (Signed)
eScribe request from [facility] for refill on TESTOSTERONE Last filled - 07/01/13, 60G Last AEX - 06/30/13 Next AEX - 09/21/14 RX has expired.  Please advise refills.

## 2013-12-24 MED ORDER — NONFORMULARY OR COMPOUNDED ITEM: Status: DC

## 2014-01-28 ENCOUNTER — Other Ambulatory Visit: Payer: Self-pay | Admitting: Neurology

## 2014-01-28 DIAGNOSIS — G4730 Sleep apnea, unspecified: Principal | ICD-10-CM

## 2014-01-28 DIAGNOSIS — G4710 Hypersomnia, unspecified: Secondary | ICD-10-CM

## 2014-01-28 NOTE — Telephone Encounter (Signed)
Dr Brett Fairy is out of the office.  Forwarding request to [facility]

## 2014-01-29 MED ORDER — AMPHETAMINE-DEXTROAMPHETAMINE 10 MG PO TABS: 10.0000 mg | ORAL_TABLET | Freq: Every day | ORAL | Status: DC

## 2014-01-29 NOTE — Telephone Encounter (Signed)
Left a message for the patient that the rx will be mailed out on today 01/29/2014.

## 2014-05-19 HISTORY — PX: MENISCUS REPAIR: SHX5179

## 2014-07-01 ENCOUNTER — Other Ambulatory Visit: Payer: Self-pay | Admitting: Diagnostic Neuroimaging

## 2014-07-08 ENCOUNTER — Ambulatory Visit: Payer: BC Managed Care – PPO | Admitting: Sports Medicine

## 2014-07-09 ENCOUNTER — Other Ambulatory Visit: Payer: Self-pay | Admitting: Diagnostic Neuroimaging

## 2014-07-09 DIAGNOSIS — G4730 Sleep apnea, unspecified: Principal | ICD-10-CM

## 2014-07-09 DIAGNOSIS — G4710 Hypersomnia, unspecified: Secondary | ICD-10-CM

## 2014-07-12 MED ORDER — AMPHETAMINE-DEXTROAMPHETAMINE 10 MG PO TABS
10.0000 mg | ORAL_TABLET | Freq: Every day | ORAL | Status: DC
Start: 2014-07-12 — End: 2014-10-19

## 2014-07-12 NOTE — Telephone Encounter (Signed)
Request forwarded to provider for approval  

## 2014-07-13 NOTE — Telephone Encounter (Signed)
Pt's Rx was mailed out today.

## 2014-07-15 ENCOUNTER — Telehealth: Payer: Self-pay | Admitting: Neurology

## 2014-07-15 NOTE — Telephone Encounter (Signed)
Error pt already has rx on the way

## 2014-07-22 ENCOUNTER — Ambulatory Visit (INDEPENDENT_AMBULATORY_CARE_PROVIDER_SITE_OTHER): Payer: BC Managed Care – PPO | Admitting: Sports Medicine

## 2014-07-22 ENCOUNTER — Encounter: Payer: Self-pay | Admitting: Sports Medicine

## 2014-07-22 VITALS — BP 110/78 | Ht 65.0 in | Wt 123.0 lb

## 2014-07-22 DIAGNOSIS — M7750 Other enthesopathy of unspecified foot: Secondary | ICD-10-CM | POA: Diagnosis not present

## 2014-07-22 DIAGNOSIS — S838X1D Sprain of other specified parts of right knee, subsequent encounter: Secondary | ICD-10-CM

## 2014-07-22 DIAGNOSIS — S8990XA Unspecified injury of unspecified lower leg, initial encounter: Secondary | ICD-10-CM | POA: Diagnosis not present

## 2014-07-22 DIAGNOSIS — M79609 Pain in unspecified limb: Secondary | ICD-10-CM

## 2014-07-22 DIAGNOSIS — S99919A Unspecified injury of unspecified ankle, initial encounter: Secondary | ICD-10-CM

## 2014-07-22 DIAGNOSIS — S838X1A Sprain of other specified parts of right knee, initial encounter: Secondary | ICD-10-CM | POA: Insufficient documentation

## 2014-07-22 DIAGNOSIS — S99929A Unspecified injury of unspecified foot, initial encounter: Secondary | ICD-10-CM

## 2014-07-22 DIAGNOSIS — M79604 Pain in right leg: Secondary | ICD-10-CM

## 2014-07-22 DIAGNOSIS — Z5189 Encounter for other specified aftercare: Secondary | ICD-10-CM | POA: Diagnosis not present

## 2014-07-22 NOTE — Assessment & Plan Note (Signed)
We will start using a MT cookie on left and continue a MT pad on RT additional forefoot padding as well Orthotics look good

## 2014-07-22 NOTE — Addendum Note (Signed)
Addended by: Stefanie Libel on: 07/22/2014 05:35 PM   Modules accepted: Level of Service

## 2014-07-22 NOTE — Assessment & Plan Note (Signed)
While she is very active she still has warmth and some mild effusion over the right knee  This is causing an abnormal gait with the right knee staying stiffer  I suggested using a compression sleeve

## 2014-07-22 NOTE — Progress Notes (Signed)
   Subjective:    Patient ID: Sarah Montoya, female    DOB: September 29, 1951, 63 y.o.   MRN: [id number]  HPI Sarah Montoya presents today for increasing left forefoot burning with walking.  She was previously seen for foot pain diagnosed as a collapsing transverse are and metatarsalgia.  She was give orthotics and metatarsal support with good result.  She now complains of some burning in the ball of her left foot, mainly with walking long distances.  Only happening past three miles of walking.  No pain in her right foot.  She also has some numbness in her second and third toes when walking a lot, usually with the pain.  No acute injury. She did have a right meniscal repair in her knee a few months ago. No other changes.  Review of Systems As per HPI    Objective:   Physical Exam Gen: Well appearing, well nourished, NAD  Feet: Noticeable collapse of transverse arch when standing, worse on left, no tenderness to palpation over metatarsal area, good neurovascular function in feet bilaterally, no masses, minor callus formation on metatarsal area or left foot, better on right  Ankle without gross instability, good ROM, no tenderness over bony prominences  RT sided antalgic gait without some external rotation of right foot with walking    Assessment & Plan:  Left foot metatarsalgia Antalgic gait secondary to right knee meniscal injury and recent surgery   Patient with some wearing down of previous metatarsal pads. Replaced these today with cookie metatarsal pads.  These felt good when she walking in clinic. Start B6 supplementation due to some nerve compression causing the numbness in her toes.   Patient fitted with and given right knee bodyhelix compression sleeve for knee stabilization and to help with antalgic gait.  This may perhaps help with her left foot pain as well with improved walking form.   F/u PRN  Note written by resident physician Excell Seltzer Reviewed and edited  Ila Mcgill,  MD

## 2014-07-22 NOTE — Patient Instructions (Addendum)
Start taking Vitamin B6 supplementation daily  Pad replacement in left shoe is a 'womens metatarsal cookie pad'  Use compression knee brace as needed for knee stabilization, especially during walking and exercise  Recheck with [place] when needed

## 2014-08-26 ENCOUNTER — Ambulatory Visit: Payer: BC Managed Care – PPO | Admitting: Neurology

## 2014-09-06 ENCOUNTER — Other Ambulatory Visit: Payer: Self-pay | Admitting: Obstetrics & Gynecology

## 2014-09-06 DIAGNOSIS — Z1231 Encounter for screening mammogram for malignant neoplasm of breast: Secondary | ICD-10-CM

## 2014-09-10 ENCOUNTER — Encounter: Payer: Self-pay | Admitting: Neurology

## 2014-09-15 ENCOUNTER — Ambulatory Visit (HOSPITAL_COMMUNITY)
Admission: RE | Admit: 2014-09-15 | Discharge: 2014-09-15 | Disposition: A | Discharge status: Home / Self Care | Payer: BC Managed Care – PPO | Source: Ambulatory Visit | Attending: Obstetrics & Gynecology | Admitting: Obstetrics & Gynecology

## 2014-09-15 DIAGNOSIS — Z1231 Encounter for screening mammogram for malignant neoplasm of breast: Secondary | ICD-10-CM | POA: Diagnosis present

## 2014-09-20 ENCOUNTER — Encounter: Payer: Self-pay | Admitting: Sports Medicine

## 2014-09-21 ENCOUNTER — Ambulatory Visit (INDEPENDENT_AMBULATORY_CARE_PROVIDER_SITE_OTHER): Payer: BC Managed Care – PPO | Admitting: Obstetrics & Gynecology

## 2014-09-21 ENCOUNTER — Encounter: Payer: Self-pay | Admitting: Obstetrics & Gynecology

## 2014-09-21 VITALS — BP 124/84 | HR 68 | Ht 65.25 in | Wt 127.0 lb

## 2014-09-21 DIAGNOSIS — Z0000 Encounter for general adult medical examination without abnormal findings: Secondary | ICD-10-CM

## 2014-09-21 DIAGNOSIS — Z124 Encounter for screening for malignant neoplasm of cervix: Secondary | ICD-10-CM

## 2014-09-21 DIAGNOSIS — R6882 Decreased libido: Secondary | ICD-10-CM

## 2014-09-21 DIAGNOSIS — Z01419 Encounter for gynecological examination (general) (routine) without abnormal findings: Secondary | ICD-10-CM

## 2014-09-21 LAB — POCT URINALYSIS DIPSTICK
BILIRUBIN UA: NEGATIVE
Glucose, UA: NEGATIVE
Ketones, UA: NEGATIVE
Nitrite, UA: NEGATIVE
Protein, UA: NEGATIVE
Urobilinogen, UA: NEGATIVE
pH, UA: 5

## 2014-09-21 LAB — COMPREHENSIVE METABOLIC PANEL
ALT: 20 U/L (ref 0–35)
AST: 22 U/L (ref 0–37)
Albumin: 4.5 g/dL (ref 3.5–5.2)
Alkaline Phosphatase: 58 U/L (ref 39–117)
BUN: 15 mg/dL (ref 6–23)
CHLORIDE: 103 meq/L (ref 96–112)
CO2: 26 mEq/L (ref 19–32)
Calcium: 9.6 mg/dL (ref 8.4–10.5)
Creat: 0.71 mg/dL (ref 0.50–1.10)
Glucose, Bld: 98 mg/dL (ref 70–99)
Potassium: 4.2 mEq/L (ref 3.5–5.3)
Sodium: 139 mEq/L (ref 135–145)
Total Bilirubin: 0.4 mg/dL (ref 0.2–1.2)
Total Protein: 7 g/dL (ref 6.0–8.3)

## 2014-09-21 LAB — HEMOGLOBIN, FINGERSTICK: Hemoglobin, fingerstick: 13.9 g/dL (ref 12.0–16.0)

## 2014-09-21 LAB — LIPID PANEL
Cholesterol: 191 mg/dL (ref 0–200)
HDL: 71 mg/dL (ref >39)
LDL Cholesterol: 98 mg/dL (ref 0–99)
Total CHOL/HDL Ratio: 2.7 Ratio
Triglycerides: 108 mg/dL (ref <150)
VLDL: 22 mg/dL (ref 0–40)

## 2014-09-21 MED ORDER — ESTRADIOL 0.1 MG/GM VA CREA: TOPICAL_CREAM | VAGINAL | Status: DC

## 2014-09-21 NOTE — Progress Notes (Signed)
63 y.o. G3P3 MarriedCaucasianF here for annual exam.  No vaginal bleeding.  D/W pt 3D MMG due to breast density.  States her insurance doesn't cover 3D at all.    Knee meniscus repair done in July.  Did some physical therapy.    Patient's last menstrual period was 07/03/2008.          Sexually active: Yes.    The current method of family planning is status post hysterectomy.    Exercising: Yes.    Home exercise routine includes walking about 3 miles a day. Smoker:  no  Health Maintenance: Pap:  06/12/12 WNL/neg HR HPV History of abnormal Pap:  no MMG:  09/15/14 Bi-Rads Neg Colonoscopy:  2007 repeat in 10 years, Dr. Cristina Gong BMD:   07/28/2013, mild osteopenia, repeat in 5 years TDaP:  06/14/2012 Screening Labs: today, Hb today: 13.9, Urine today: WBC-small,RBC-trace, Ph: 5.0   reports that she has never smoked. She has never used smokeless tobacco. She reports that she drinks about 1.0 - 1.5 oz of alcohol per week. She reports that she does not use illicit drugs.  Past Medical History  Diagnosis Date  . Hypothyroidism   . Narcolepsy   . Basal cell carcinoma   . Hx of migraines   . Drowsiness     excessive daytime  . Hypersomnia 08/26/2013    History reviewed. No pertinent past surgical history.  Current Outpatient Prescriptions  Medication Sig Dispense Refill  . amphetamine-dextroamphetamine (ADDERALL) 10 MG tablet Take 1 tablet (10 mg total) by mouth daily. 90 tablet 0  . cholecalciferol (VITAMIN D) 1000 UNITS tablet Take 1,000 Units by mouth daily.    . clobetasol ointment (TEMOVATE) 0.05 % Apply topically as needed. 30 g 1  . CYTOMEL 5 MCG tablet     . estradiol (ESTRACE) 0.1 MG/GM vaginal cream 1 gram vaginally up two twice weekly. 42.5 g 1  . Lactase (LACTAID PO) Take by mouth as needed.    Marland Kitchen levothyroxine (SYNTHROID, LEVOTHROID) 88 MCG tablet Take 88 mcg by mouth daily before breakfast.    . NONFORMULARY OR COMPOUNDED ITEM Testosterone 1% in petrolatum 1/4 tsp 2-3 x weekly,  Disp 60grams 1 each 2  . SUMAtriptan (IMITREX) 100 MG tablet 100 mg as needed.     No current facility-administered medications for this visit.    Family History  Problem Relation Age of Onset  . Breast cancer Mother 61  . Osteoporosis Mother   . Prostate cancer Father   . Thyroid disease Father   . Mental retardation Sister     ROS:  Pertinent items are noted in HPI.  Otherwise, a comprehensive ROS was negative.  Exam:   BP 124/84 mmHg  Pulse 68  Ht 5' 5.25" (1.657 m)  Wt 127 lb (57.607 kg)  BMI 20.98 kg/m2  LMP 07/03/2008  Weight change: +5#  Height: 5' 5.25" (165.7 cm)  Ht Readings from Last 3 Encounters:  09/21/14 5' 5.25" (1.657 m)  07/22/14 5\' 5"  (1.651 m)  08/26/13 5\' 6"  (1.676 m)    General appearance: alert, cooperative and appears stated age Head: Normocephalic, without obvious abnormality, atraumatic Neck: no adenopathy, supple, symmetrical, trachea midline and thyroid normal to inspection and palpation Lungs: clear to auscultation bilaterally Breasts: normal appearance, no masses or tenderness Heart: regular rate and rhythm Abdomen: soft, non-tender; bowel sounds normal; no masses,  no organomegaly Extremities: extremities normal, atraumatic, no cyanosis or edema Skin: Skin color, texture, turgor normal. No rashes or lesions Lymph nodes: Cervical, supraclavicular,  and axillary nodes normal. No abnormal inguinal nodes palpated Neurologic: Grossly normal   Pelvic: External genitalia:  no lesions              Urethra:  normal appearing urethra with no masses, tenderness or lesions              Bartholins and Skenes: normal                 Vagina: normal appearing vagina with normal color and discharge, no lesions              Cervix: no lesions              Pap taken: Yes.   Bimanual Exam:  Uterus:  normal size, contour, position, consistency, mobility, non-tender              Adnexa: normal adnexa and no mass, fullness, tenderness                Rectovaginal: Confirms               Anus:  normal sphincter tone, no lesions  A:  Well Woman with normal exam PMP, No HRT H/O LS&A Decreased libido Hypothyroidism Low Vit D  P: Mammogram yearly.  pap smear today TSH, Vit D, total testosterone today Estrace cream rx given return annually or prn

## 2014-09-22 LAB — TESTOSTERONE: TESTOSTERONE: 39 ng/dL (ref 10–70)

## 2014-09-22 LAB — VITAMIN D 25 HYDROXY (VIT D DEFICIENCY, FRACTURES): VIT D 25 HYDROXY: 67 ng/mL (ref 30–89)

## 2014-09-24 LAB — IPS PAP TEST WITH REFLEX TO HPV

## 2014-10-18 ENCOUNTER — Encounter: Payer: Self-pay | Admitting: Neurology

## 2014-10-18 DIAGNOSIS — G4710 Hypersomnia, unspecified: Secondary | ICD-10-CM

## 2014-10-19 MED ORDER — AMPHETAMINE-DEXTROAMPHETAMINE 10 MG PO TABS: 10.0000 mg | ORAL_TABLET | Freq: Every day | ORAL | Status: DC

## 2014-10-20 ENCOUNTER — Telehealth: Payer: Self-pay | Admitting: *Deleted

## 2014-10-20 NOTE — Telephone Encounter (Signed)
Per Dr. Brett Fairy, I called the patient to schedule an appointment for her to continue to be able to get her Class II Adderall prescriptions.  Patient made an appt for 10/21/14 at 1545.

## 2014-10-21 ENCOUNTER — Encounter: Payer: Self-pay | Admitting: Neurology

## 2014-10-21 ENCOUNTER — Ambulatory Visit (INDEPENDENT_AMBULATORY_CARE_PROVIDER_SITE_OTHER): Payer: BC Managed Care – PPO | Admitting: Neurology

## 2014-10-21 VITALS — BP 133/83 | HR 80 | Resp 12 | Ht 64.5 in | Wt 126.0 lb

## 2014-10-21 DIAGNOSIS — I7300 Raynaud's syndrome without gangrene: Secondary | ICD-10-CM

## 2014-10-21 DIAGNOSIS — E039 Hypothyroidism, unspecified: Secondary | ICD-10-CM

## 2014-10-21 DIAGNOSIS — E031 Congenital hypothyroidism without goiter: Secondary | ICD-10-CM

## 2014-10-21 DIAGNOSIS — G4710 Hypersomnia, unspecified: Secondary | ICD-10-CM | POA: Insufficient documentation

## 2014-10-21 HISTORY — DX: Hypothyroidism, unspecified: E03.9

## 2014-10-21 NOTE — Progress Notes (Signed)
[facility]  Provider:  Larey Seat, M D  Referring Provider: Josetta Huddle, MD Primary Care Physician:  Henrine Screws, MD  Chief Complaint  Patient presents with  . RV    Rm '10, alone    HPI:  Sarah Montoya is a 63 y.o. female  Is seen here in her y yearly RV , originally referred by Dr. Inda Merlin.  CC: excessive daytime sleepiness,  Tested in March 2009 I followed up with her and saw her for excessive daytime sleepiness, at that time , n she had already taken Provigil  but her Epworth scale was endorsed between 0 and 2 points,  she had no trouble controlling her urges to sleep on the medication.  As her insurance changed  Provigil costs for the patient became prohibitive-  this is when she changed to Adderall.   Here for RV .  History   Social History  . Marital Status: Married    Spouse Name: N/A    Number of Children: 3  . Years of Education: college   Occupational History  . instructor     [employer]   Social History Main Topics  . Smoking status: Never Smoker   . Smokeless tobacco: Never Used  . Alcohol Use: 1.0 - 1.5 oz/week    2-3 Not specified per week     Comment: 4 glasses of wine weekly  . Drug Use: No  . Sexual Activity:    Partners: Male   Other Topics Concern  . Not on file   Social History Narrative   See previous notes.    Family History  Problem Relation Age of Onset  . Breast cancer Mother 7  . Osteoporosis Mother   . Prostate cancer Father   . Thyroid disease Father   . Mental retardation Sister     Past Medical History  Diagnosis Date  . Hypothyroidism   . Narcolepsy   . Basal cell carcinoma   . Hx of migraines   . Drowsiness     excessive daytime  . Hypersomnia 08/26/2013  . Hypothyroidism 10/21/2014    Past Surgical History  Procedure Laterality Date  . Meniscus repair Right 05/2014    Current Outpatient Prescriptions  Medication Sig Dispense Refill  . amphetamine-dextroamphetamine  (ADDERALL) 10 MG tablet Take 1 tablet (10 mg total) by mouth daily. 90 tablet 0  . cholecalciferol (VITAMIN D) 1000 UNITS tablet Take 1,000 Units by mouth daily.    . clobetasol ointment (TEMOVATE) 0.05 % Apply topically as needed. 30 g 1  . CYTOMEL 5 MCG tablet Take 5 mcg by mouth 2 (two) times daily.     Marland Kitchen estradiol (ESTRACE) 0.1 MG/GM vaginal cream 1 gram vaginally up two twice weekly. 42.5 g 2  . Lactase (LACTAID PO) Take by mouth as needed.    . NONFORMULARY OR COMPOUNDED ITEM Testosterone 1% in petrolatum 1/4 tsp 2-3 x weekly, Disp 60grams (Patient taking differently: as needed. Testosterone 1% in petrolatum 1/4 tsp 2-3 x weekly, Disp 60grams) 1 each 2  . SUMAtriptan (IMITREX) 100 MG tablet 100 mg as needed.    Marland Kitchen SYNTHROID 50 MCG tablet Pt takes 27mcg every other day, then takes 46mcg alternating days.  0   No current facility-administered medications for this visit.    Allergies as of 10/21/2014  . (No Known Allergies)    Vitals: BP 133/83 mmHg  Pulse 80  Resp 12  Ht 5' 4.5" (1.638 m)  Wt 126 lb (57.153 kg)  BMI  21.30 kg/m2  LMP 07/03/2008 Last Weight:  Wt Readings from Last 1 Encounters:  10/21/14 126 lb (57.153 kg)   Last Height:   Ht Readings from Last 1 Encounters:  10/21/14 5' 4.5" (1.638 m)    Physical exam:  General: The patient is awake, alert and appears not in acute distress. The patient is well groomed. Head: Normocephalic, atraumatic. Neck is supple. Mallampati 2, neck circumference: 13 , no nasal deviation. No surgical changes to upper airway  Cardiovascular:  Regular rate and rhythm , without  murmurs or carotid bruit, and without distended neck veins. Respiratory: Lungs are clear to auscultation. Skin:  Without evidence of edema, or rash- she has very cold, blue-purplish discoloured hands- Reynaud's.  Trunk: BMI is normal, with normal  posture.  Neurologic exam : The patient is awake and alert, oriented to place and time.  Memory subjective described  as intact.  There is a normal attention span & concentration ability.  Speech is fluent without dysarthria,  Mild dysphonia , no  Aphasia.  Mood and affect are appropriate. She is not depressed , not impulsive or angry.   Cranial nerves: Pupils are equal and briskly reactive to light. Funduscopic exam without  evidence of pallor or edema. Extraocular movements  in vertical and horizontal planes intact and without nystagmus. RARE EYEBLINK,  Visual fields by finger perimetry are intact. Hearing to finger rub intact.  Facial sensation intact to fine touch. Facial motor strength is symmetric and tongue and uvula move midline. Normal ROM of the neck.    Motor exam:   Normal muscle bulk and symmetric normal strength in all extremities. There is a slightly general elevated tone throughout upper and lower extremities, a masked face , but no tremor, no cog -wheeling. .  Sensory:  Fine touch, pinprick and vibration were tested in all extremities. Proprioception is normal. Coordination: Rapid alternating movements in the fingers/hands is tested and appear slowed- but patient repots no trouble typing , button closing.  Gait and station: Patient walks without assistive device . Strength within normal limits. Stance is stable and normal.  Romberg testing is normal. No falls in the last 12 month.   Deep tendon reflexes: in the  upper and lower extremities are symmetric and intact. Babinski maneuver response is  downgoing.   Assessment:  After physical and neurologic examination, review of laboratory studies, imaging, neurophysiology testing and pre-existing records,   assessment is that of subjective hypersomnia, or hypersomnia of unknown origin , controlled by adderall.   Plan:  Treatment plan and additional workup  RV every 12 month for medication refill.   I again :refilled  adderall.  10 mg tab daily, divided bid.to help with hypersomnia.  Recent visit with endocrinologist Dr. Buddy Duty was revealing a  lower TSH, she started on lower synthroid dose and on cytomel.  Patients BP remains low. Still has rare Eye blink and hoarse voice.  She has Raynaud's syndrome. Id very cold sensitive.

## 2014-11-17 ENCOUNTER — Ambulatory Visit: Payer: BC Managed Care – PPO | Admitting: Neurology

## 2014-11-18 ENCOUNTER — Ambulatory Visit: Payer: BC Managed Care – PPO | Admitting: Neurology

## 2014-11-22 ENCOUNTER — Ambulatory Visit: Payer: BC Managed Care – PPO | Admitting: Neurology

## 2015-01-07 ENCOUNTER — Other Ambulatory Visit: Payer: Self-pay | Admitting: Neurology

## 2015-01-07 DIAGNOSIS — G4710 Hypersomnia, unspecified: Secondary | ICD-10-CM

## 2015-01-12 MED ORDER — AMPHETAMINE-DEXTROAMPHETAMINE 10 MG PO TABS: 10.0000 mg | ORAL_TABLET | Freq: Every day | ORAL | Status: DC

## 2015-01-17 ENCOUNTER — Telehealth: Payer: Self-pay

## 2015-01-17 NOTE — Telephone Encounter (Signed)
Called patient to inform Rx ready for pick up at front desk. No answer. Left vmail.

## 2015-04-21 ENCOUNTER — Other Ambulatory Visit: Payer: Self-pay | Admitting: Neurology

## 2015-04-21 DIAGNOSIS — Z85828 Personal history of other malignant neoplasm of skin: Secondary | ICD-10-CM | POA: Diagnosis not present

## 2015-04-21 DIAGNOSIS — L0889 Other specified local infections of the skin and subcutaneous tissue: Secondary | ICD-10-CM | POA: Diagnosis not present

## 2015-04-21 DIAGNOSIS — L570 Actinic keratosis: Secondary | ICD-10-CM | POA: Diagnosis not present

## 2015-04-25 ENCOUNTER — Other Ambulatory Visit: Payer: Self-pay | Admitting: Neurology

## 2015-04-25 ENCOUNTER — Telehealth: Payer: Self-pay

## 2015-04-25 DIAGNOSIS — G4710 Hypersomnia, unspecified: Secondary | ICD-10-CM

## 2015-04-25 MED ORDER — AMPHETAMINE-DEXTROAMPHETAMINE 10 MG PO TABS: 10.0000 mg | ORAL_TABLET | Freq: Every day | ORAL | Status: DC

## 2015-04-25 NOTE — Telephone Encounter (Signed)
Called pt to tell her that her RX is ready for pick up at the front desk. No answer, left message.

## 2015-04-25 NOTE — Telephone Encounter (Signed)
Request entered, forwarded to provider for approval.  

## 2015-04-25 NOTE — Telephone Encounter (Signed)
Patient called requesting a refill for amphetamine-dextroamphetamine (ADDERALL) 10 MG tablet. Patient was advised the script will be ready for pick up in 24 hrs unless she is advised otherwise by the nurse. Patient can be reached @ [phone]

## 2015-04-27 DIAGNOSIS — E039 Hypothyroidism, unspecified: Secondary | ICD-10-CM | POA: Diagnosis not present

## 2015-05-04 ENCOUNTER — Ambulatory Visit (INDEPENDENT_AMBULATORY_CARE_PROVIDER_SITE_OTHER): Payer: Medicare Other | Admitting: Sports Medicine

## 2015-05-04 ENCOUNTER — Encounter: Payer: Self-pay | Admitting: Sports Medicine

## 2015-05-04 VITALS — BP 139/84 | Ht 65.0 in | Wt 123.0 lb

## 2015-05-04 DIAGNOSIS — M7750 Other enthesopathy of unspecified foot: Secondary | ICD-10-CM

## 2015-05-04 DIAGNOSIS — M779 Enthesopathy, unspecified: Secondary | ICD-10-CM | POA: Diagnosis not present

## 2015-05-04 NOTE — Progress Notes (Signed)
   Subjective:    Patient ID: Sarah Montoya, female    DOB: [birth year], 64 y.o.   MRN: [id number]  HPI Marcille is a 64 year old female presents for followup of left foot pain.  She has a history of left metatarsalgia.  She had custom orthotics, for her last year and she'll last seen in September 2015.  She says that the custom orthotics were somewhat irritating her feet, so she switched back to the green support insoles.  She finds aggravation with walking for prolonged times.  Location of pain is primarily between the second and third toes.  Character of pain is in numbness and tingling.  She denies noticing any palpable masses. No injuries.  No bruising or swelling.  Past medical history, social history, medications, and allergies were reviewed and are up to date in the chart.  Review of Systems 7 point review of systems was performed and was otherwise negative unless noted in the history of present illness.     Objective:   Physical Exam BP 139/84 mmHg  Ht 5\' 5"  (1.651 m)  Wt 123 lb (55.792 kg)  BMI 20.47 kg/m2  LMP 07/03/2008 GEN: The patient is well-developed well-nourished female and in no acute distress.  She is awake alert and oriented x3. SKIN: warm and well-perfused, no rash  EXTR: No lower extremity edema or calf tenderness Neuro: Strength 5/5 globally. Sensation intact throughout. No focal deficits. Vasc: +2 bilateral distal pulses. No edema.  MSK: Examination the bilateral feet reveals that she has slight splaying on the right foot between the first and second metatarsals.  She has a slightly pes cavus foot.  She has fairly well-preserved transverse arches.  She has slight hyperpronation and calcaneus valgus of the left ankle.  Positive metatarsal squeeze test of the left foot.  No skin changes.     Assessment & Plan:  Please see problem based assessment and plan in the problem list.

## 2015-05-04 NOTE — Assessment & Plan Note (Signed)
-  Remove the left metatarsal pad from her custom insoles, ground down the bottom metatarsal condition.  Skipped 1 lace in both her shoes to allow more room. -Advised her regarding purchase of new footwear without a high left anterior insole that turns up the toe. -She found relief with this modification. -We'll see her back if needed for further adjustments if needed.

## 2015-05-09 DIAGNOSIS — H04222 Epiphora due to insufficient drainage, left lacrimal gland: Secondary | ICD-10-CM | POA: Diagnosis not present

## 2015-05-09 DIAGNOSIS — H00035 Abscess of left lower eyelid: Secondary | ICD-10-CM | POA: Diagnosis not present

## 2015-05-09 DIAGNOSIS — H11432 Conjunctival hyperemia, left eye: Secondary | ICD-10-CM | POA: Diagnosis not present

## 2015-05-16 DIAGNOSIS — A429 Actinomycosis, unspecified: Secondary | ICD-10-CM | POA: Diagnosis not present

## 2015-05-16 DIAGNOSIS — H00035 Abscess of left lower eyelid: Secondary | ICD-10-CM | POA: Diagnosis not present

## 2015-05-16 DIAGNOSIS — T1582XD Foreign body in other and multiple parts of external eye, left eye, subsequent encounter: Secondary | ICD-10-CM | POA: Diagnosis not present

## 2015-05-16 DIAGNOSIS — H04562 Stenosis of left lacrimal punctum: Secondary | ICD-10-CM | POA: Diagnosis not present

## 2015-07-18 ENCOUNTER — Other Ambulatory Visit: Payer: Self-pay | Admitting: Neurology

## 2015-07-18 ENCOUNTER — Other Ambulatory Visit: Payer: Self-pay

## 2015-07-18 DIAGNOSIS — G4710 Hypersomnia, unspecified: Secondary | ICD-10-CM

## 2015-07-18 MED ORDER — AMPHETAMINE-DEXTROAMPHETAMINE 10 MG PO TABS: 10.0000 mg | ORAL_TABLET | Freq: Every day | ORAL | Status: DC

## 2015-07-18 NOTE — Telephone Encounter (Signed)
Patient has annual appt scheduled

## 2015-07-19 ENCOUNTER — Telehealth: Payer: Self-pay

## 2015-07-19 NOTE — Telephone Encounter (Signed)
Called pt to advise her that her RX is ready for pick up at the front desk. Left a message asking her to call us back.

## 2015-07-21 DIAGNOSIS — E039 Hypothyroidism, unspecified: Secondary | ICD-10-CM | POA: Diagnosis not present

## 2015-07-21 DIAGNOSIS — Z23 Encounter for immunization: Secondary | ICD-10-CM | POA: Diagnosis not present

## 2015-07-22 ENCOUNTER — Telehealth: Payer: Self-pay | Admitting: Neurology

## 2015-07-22 NOTE — Telephone Encounter (Signed)
Pt calling and is stating that her pharmacy is requesting prior-auth. For rx [facility] - [address]  [phone]  Pt states that she is to go out of town early next week and just wants to know how soon this will be taken care of . May call [phone], may leave vm.

## 2015-07-22 NOTE — Telephone Encounter (Addendum)
We do not have the patient's prescription benefit ins on file.  I called the pharmacy to clarify this info.  Spoke with Larene Beach who said the patient's coverage is through [employer] and [employer] [phone]; ID# [id number].  Ins has been contacted and provided with clinical info.  Request is currently under review Ref # [id number].  I called the patient back to advise.  Got no answer.  Left message.   Ins has approved the request for coverage on Adderall effective until 11/19/2015 Ref # [id number] Ins has notified patient, approval notice has been faxed to pharmacy.

## 2015-08-17 DIAGNOSIS — L821 Other seborrheic keratosis: Secondary | ICD-10-CM | POA: Diagnosis not present

## 2015-08-17 DIAGNOSIS — L814 Other melanin hyperpigmentation: Secondary | ICD-10-CM | POA: Diagnosis not present

## 2015-08-17 DIAGNOSIS — L570 Actinic keratosis: Secondary | ICD-10-CM | POA: Diagnosis not present

## 2015-08-17 DIAGNOSIS — Z85828 Personal history of other malignant neoplasm of skin: Secondary | ICD-10-CM | POA: Diagnosis not present

## 2015-10-24 ENCOUNTER — Encounter: Payer: Self-pay | Admitting: Nurse Practitioner

## 2015-10-24 ENCOUNTER — Ambulatory Visit (INDEPENDENT_AMBULATORY_CARE_PROVIDER_SITE_OTHER): Payer: Medicare Other | Admitting: Nurse Practitioner

## 2015-10-24 VITALS — BP 113/79 | HR 83 | Wt 128.4 lb

## 2015-10-24 DIAGNOSIS — G4710 Hypersomnia, unspecified: Secondary | ICD-10-CM

## 2015-10-24 MED ORDER — AMPHETAMINE-DEXTROAMPHETAMINE 10 MG PO TABS: 10.0000 mg | ORAL_TABLET | Freq: Every day | ORAL | Status: DC

## 2015-10-24 NOTE — Patient Instructions (Signed)
Continue Adderall at current dose will refill for 3 months Follow-up yearly and when necessary

## 2015-10-24 NOTE — Progress Notes (Signed)
I agree with the assessment and plan as directed by NP .The patient is known to me .   Marilu Rylander, MD  

## 2015-10-24 NOTE — Progress Notes (Signed)
[facility]  PATIENT: Sarah Montoya DOB: 1950-12-23   REASON FOR VISIT: Persistent hypersomnia HISTORY FROM: Patient    HISTORY OF PRESENT ILLNESS:Sarah Montoya is a 64 y.o. female Is seen here in her  yearly RV for excessive daytime sleepiness Tested in March 2009 and followed since that time.She  had already taken Provigil but her Epworth score was endorsed between 0 and 2 points, she had no trouble controlling her urges to sleep on the medication.  As her insurance changed Provigil costs for the patient became prohibitive- This is when she changed to Adderall. She actually takes a half of a tablet and additional half needed. She returns for reevaluation. She has no new complaints    REVIEW OF SYSTEMS: Full 14 system review of systems performed and notable only for those listed, all others are neg:  Constitutional: neg  Cardiovascular: neg Ear/Nose/Throat: neg  Skin: neg Eyes: neg Respiratory: neg Gastroitestinal: neg  Hematology/Lymphatic: neg  Endocrine: neg Musculoskeletal:neg Allergy/Immunology: neg Neurological: neg Psychiatric: neg Sleep : Daytime sleepiness   ALLERGIES: No Known Allergies  HOME MEDICATIONS: Outpatient Prescriptions Prior to Visit  Medication Sig Dispense Refill  . amphetamine-dextroamphetamine (ADDERALL) 10 MG tablet Take 1 tablet (10 mg total) by mouth daily. 90 tablet 0  . cholecalciferol (VITAMIN D) 1000 UNITS tablet Take 1,000 Units by mouth daily.    . clobetasol ointment (TEMOVATE) 0.05 % Apply topically as needed. 30 g 1  . CYTOMEL 5 MCG tablet Take 5 mcg by mouth 2 (two) times daily.     Marland Kitchen estradiol (ESTRACE) 0.1 MG/GM vaginal cream 1 gram vaginally up two twice weekly. 42.5 g 2  . Lactase (LACTAID PO) Take by mouth as needed.    . NONFORMULARY OR COMPOUNDED ITEM Testosterone 1% in petrolatum 1/4 tsp 2-3 x weekly, Disp 60grams (Patient taking differently: as needed. Testosterone 1% in petrolatum 1/4 tsp 2-3 x  weekly, Disp 60grams) 1 each 2  . SUMAtriptan (IMITREX) 100 MG tablet 100 mg as needed.    Marland Kitchen SYNTHROID 50 MCG tablet Pt takes 25mcg every other day, then takes 52mcg alternating days.  0   No facility-administered medications prior to visit.    PAST MEDICAL HISTORY: Past Medical History  Diagnosis Date  . Hypothyroidism   . Narcolepsy   . Basal cell carcinoma   . Hx of migraines   . Drowsiness     excessive daytime  . Hypersomnia 08/26/2013  . Hypothyroidism 10/21/2014    PAST SURGICAL HISTORY: Past Surgical History  Procedure Laterality Date  . Meniscus repair Right 05/2014    FAMILY HISTORY: Family History  Problem Relation Age of Onset  . Breast cancer Mother 32  . Osteoporosis Mother   . Prostate cancer Father   . Thyroid disease Father   . Mental retardation Sister     SOCIAL HISTORY: Social History   Social History  . Marital Status: Married    Spouse Name: N/A  . Number of Children: 3  . Years of Education: college   Occupational History  . instructor     [employer]   Social History Main Topics  . Smoking status: Never Smoker   . Smokeless tobacco: Never Used  . Alcohol Use: 1.0 - 1.5 oz/week    2-3 Standard drinks or equivalent per week     Comment: 4 glasses of wine weekly  . Drug Use: No  . Sexual Activity:    Partners: Male   Other Topics Concern  . Not on file  Social History Narrative   See previous notes.     PHYSICAL EXAM  Filed Vitals:   10/24/15 1354  BP: 113/79  Pulse: 83  Weight: 128 lb 6.4 oz (58.242 kg)   Body mass index is 21.37 kg/(m^2). General: The patient is awake, alert and appears not in acute distress. The patient is well groomed. Head: Normocephalic, atraumatic.  Neck is supple. , No carotid bruit Cardiovascular: Regular rate and rhythm , without murmurs  Skin: Without evidence of edema, or rash- she has very cold, blue-purplish discoloured hands- Reynaud's.   Neurologic exam : The patient is  awake and alert, oriented to place and time. Memory subjective described as intact. There is a normal attention span & concentration ability. Speech is fluent without dysarthria, Mild dysphonia , no Aphasia. Mood and affect are appropriate.  Cranial nerves: Pupils are equal and briskly reactive to light. Funduscopic exam without evidence of pallor or edema. Extraocular movements in vertical and horizontal planes intact and without nystagmus. RARE EYEBLINK, Visual fields by finger perimetry are intact. Hearing to finger rub intact. Facial sensation intact to fine touch. Facial motor strength is symmetric and tongue and uvula move midline. Normal ROM of the neck.  Motor exam: Normal muscle bulk and symmetric normal strength in all extremities. There is a slightly general elevated tone throughout upper and lower extremities, a masked face , but no tremor, no cog -wheeling. .  Sensory: Fine touch, pinprick and vibration were tested in all extremities. Proprioception is normal. Coordination: Rapid alternating movements in the fingers/hands is tested and appear slowed- Gait and station: Patient walks without assistive device . Strength within normal limits. Stance is stable and normal. Romberg testing is normal.   Deep tendon reflexes: in the upper and lower extremities are symmetric and intact. Babinski maneuver response is downgoing. DIAGNOSTIC DATA (LABS, IMAGING, TESTING) -  ASSESSMENT AND PLAN  64 y.o. year old female  has a past medical history of  Hypersomnia (08/26/2013); here to follow-up. She is currently treated with Adderall.  Continue Adderall at current dose will refill for 3 months, RX to patient Follow-up yearly and when necessary Dennie Bible, [facility], [facility], APRN  [facility] [address] [place], [place] [address] [phone]

## 2015-11-24 ENCOUNTER — Encounter: Payer: Self-pay | Admitting: Obstetrics & Gynecology

## 2015-11-24 MED ORDER — ESTRADIOL 0.1 MG/GM VA CREA: TOPICAL_CREAM | VAGINAL | Status: DC

## 2015-11-25 ENCOUNTER — Other Ambulatory Visit: Payer: Self-pay

## 2015-11-25 DIAGNOSIS — Z1231 Encounter for screening mammogram for malignant neoplasm of breast: Secondary | ICD-10-CM

## 2015-11-29 ENCOUNTER — Ambulatory Visit: Payer: Medicare Other | Admitting: Obstetrics & Gynecology

## 2015-11-30 ENCOUNTER — Encounter: Payer: Medicare Other | Admitting: Family Medicine

## 2015-12-07 ENCOUNTER — Encounter: Payer: Self-pay | Admitting: Family Medicine

## 2015-12-07 ENCOUNTER — Ambulatory Visit (INDEPENDENT_AMBULATORY_CARE_PROVIDER_SITE_OTHER): Payer: Medicare Other | Admitting: Family Medicine

## 2015-12-07 VITALS — BP 135/95 | Ht 65.0 in | Wt 123.0 lb

## 2015-12-07 DIAGNOSIS — R269 Unspecified abnormalities of gait and mobility: Secondary | ICD-10-CM | POA: Diagnosis present

## 2015-12-07 NOTE — Progress Notes (Signed)
   Subjective:    Patient ID: Sarah Montoya, female    DOB: [birth year], 65 y.o.   MRN: [id number]  HPI Lashonia is a 65 year old female presents for followup of left foot pain.  She has a history of left metatarsalgia.  She had custom orthotics made in 2015.  Had modifications in her custom orthotics including removal of MT pad due to irritation.   Past medical history, social history, medications, and allergies were reviewed and are up to date in the chart.  Review of Systems 7 point review of systems was performed and was otherwise negative unless noted in the history of present illness.     Objective:   Physical Exam BP 135/95 mmHg  Ht 5\' 5"  (1.651 m)  Wt 123 lb (55.792 kg)  BMI 20.47 kg/m2  LMP 07/03/2008 GEN: The patient is well-developed well-nourished female and in no acute distress.  She is awake alert and oriented x3. SKIN: warm and well-perfused, no rash  EXTR: No lower extremity edema or calf tenderness Neuro: Strength 5/5 globally. Sensation intact throughout. No focal deficits. Vasc: +2 bilateral distal pulses. No edema.  MSK: Examination the bilateral feet reveals that she has slight splaying on the right foot between the first and second metatarsals.  She has a slightly pes cavus foot.  Collapse of transverse arch.  She has slight hyperpronation and calcaneus valgus of the left ankle.  + hammertoeing of R>L.     Assessment & Plan:  Please see problem based assessment and plan in the problem list.

## 2015-12-07 NOTE — Assessment & Plan Note (Signed)
Patient was fitted for a : standard, cushioned, semi-rigid orthotic. The orthotic was heated and afterward the patient stood on the orthotic blank positioned on the orthotic stand. The patient was positioned in subtalar neutral position and 10 degrees of ankle dorsiflexion in a weight bearing stance. After completion of molding, a stable base was applied to the orthotic blank. The blank was ground to a stable position for weight bearing. Size: 7 red EVA Base: Blue Posting: None  Additional orthotic padding: None  Time 45 mins  Gait was neutral with control of pronation at completion

## 2015-12-09 ENCOUNTER — Ambulatory Visit
Admission: RE | Admit: 2015-12-09 | Discharge: 2015-12-09 | Disposition: A | Discharge status: Home / Self Care | Payer: Medicare Other | Source: Ambulatory Visit

## 2015-12-09 DIAGNOSIS — Z1231 Encounter for screening mammogram for malignant neoplasm of breast: Secondary | ICD-10-CM | POA: Diagnosis not present

## 2016-01-13 ENCOUNTER — Encounter: Payer: Self-pay | Admitting: Obstetrics & Gynecology

## 2016-01-13 ENCOUNTER — Ambulatory Visit (INDEPENDENT_AMBULATORY_CARE_PROVIDER_SITE_OTHER): Payer: Medicare Other | Admitting: Obstetrics & Gynecology

## 2016-01-13 VITALS — BP 100/80 | HR 90 | Resp 16 | Ht 64.5 in | Wt 126.0 lb

## 2016-01-13 DIAGNOSIS — Z0000 Encounter for general adult medical examination without abnormal findings: Secondary | ICD-10-CM | POA: Diagnosis not present

## 2016-01-13 DIAGNOSIS — Z124 Encounter for screening for malignant neoplasm of cervix: Secondary | ICD-10-CM

## 2016-01-13 DIAGNOSIS — Z01419 Encounter for gynecological examination (general) (routine) without abnormal findings: Secondary | ICD-10-CM | POA: Diagnosis not present

## 2016-01-13 LAB — POCT URINALYSIS DIPSTICK
Bilirubin, UA: NEGATIVE
GLUCOSE UA: NEGATIVE
KETONES UA: NEGATIVE
Nitrite, UA: NEGATIVE
Protein, UA: NEGATIVE
Urobilinogen, UA: NEGATIVE
pH, UA: 6

## 2016-01-13 MED ORDER — ESTRADIOL 10 MCG VA TABS: 1.0000 | ORAL_TABLET | VAGINAL | Status: DC

## 2016-01-13 NOTE — Progress Notes (Signed)
65 y.o. G3P3 MarriedCaucasianF here for annual exam.  Doing well.  Seeing neurology yearly.  Seeing Dr. Brett Fairy.    PCP:  Dr. Inda Merlin.  Has seen endocrinologist as well this past year.  Alternating the 57mcg and 37mcg dosage every other day.  Thyroid testing was normal.    Denies vaginal bleeding.    D/W pt Hep C testing guidelines.  Pt has giving to the [employer] within the last two years.  Has questions about Estrace cream because of cost.  Does not need testosterone RF.  Using "ocasionally".   Both daughters had children last year.  One lives in [place].  Second granddaughter is in [place].  They are traveling a lot on the weekends to see the granddaughters.   Patient's last menstrual period was 07/03/2008.          Sexually active: Yes.    The current method of family planning is post menopausal status.    Exercising: Yes.    Walking, gym Smoker:  no  Health Maintenance: Pap:  09/21/14 Neg. 06/12/12 Neg. HR HPV:neg History of abnormal Pap:  no MMG: 12/09/15 BIRADS1:Neg Colonoscopy: 2007 Normal - repeat 10 years, Dr. Cristina Gong BMD: 07/28/2013 Mild Osteopenia   TDaP:  05/2012  Shingles:  2013 Pneumonia:  Has done one, 9/16. Screening Labs: PCP, Hb today: PCP, Urine today: pending   reports that she has never smoked. She has never used smokeless tobacco. She reports that she drinks about 1.0 - 1.5 oz of alcohol per week. She reports that she does not use illicit drugs.  Past Medical History  Diagnosis Date  . Hypothyroidism   . Narcolepsy   . Basal cell carcinoma   . Hx of migraines   . Drowsiness     excessive daytime  . Hypersomnia 08/26/2013  . Hypothyroidism 10/21/2014    Past Surgical History  Procedure Laterality Date  . Meniscus repair Right 05/2014    Current Outpatient Prescriptions  Medication Sig Dispense Refill  . amphetamine-dextroamphetamine (ADDERALL) 10 MG tablet Take 1 tablet (10 mg total) by mouth daily. 90 tablet 0  . cholecalciferol (VITAMIN D) 1000  UNITS tablet Take 1,000 Units by mouth daily.    . clobetasol ointment (TEMOVATE) 0.05 % Apply topically as needed. 30 g 1  . CYTOMEL 5 MCG tablet Take 5 mcg by mouth 2 (two) times daily.     Marland Kitchen estradiol (ESTRACE) 0.1 MG/GM vaginal cream 1 gram vaginally up two twice weekly. 42.5 g 2  . Lactase (LACTAID PO) Take by mouth as needed.    Marland Kitchen levothyroxine (SYNTHROID, LEVOTHROID) 75 MCG tablet 75 mcg. Alternates with 50 mcg every other day    . NONFORMULARY OR COMPOUNDED ITEM Testosterone 1% in petrolatum 1/4 tsp 2-3 x weekly, Disp 60grams (Patient taking differently: as needed. Testosterone 1% in petrolatum 1/4 tsp 2-3 x weekly, Disp 60grams) 1 each 2  . SUMAtriptan (IMITREX) 100 MG tablet 100 mg as needed.    Marland Kitchen SYNTHROID 50 MCG tablet Pt takes 16mcg every other day, then takes 4mcg alternating days.  0   No current facility-administered medications for this visit.    Family History  Problem Relation Age of Onset  . Breast cancer Mother 79  . Osteoporosis Mother   . Prostate cancer Father   . Thyroid disease Father   . Mental retardation Sister     ROS:  Pertinent items are noted in HPI.  Otherwise, a comprehensive ROS was negative.  Exam:   BP 100/80 mmHg  Pulse 90  Resp 16  Ht 5' 4.5" (1.638 m)  Wt 126 lb (57.153 kg)  BMI 21.30 kg/m2  LMP 07/03/2008  Weight change: +1#   Height: 5' 4.5" (163.8 cm)  Ht Readings from Last 3 Encounters:  01/13/16 5' 4.5" (1.638 m)  12/07/15 5\' 5"  (1.651 m)  05/04/15 5\' 5"  (1.651 m)    General appearance: alert, cooperative and appears stated age Head: Normocephalic, without obvious abnormality, atraumatic Neck: no adenopathy, supple, symmetrical, trachea midline and thyroid normal to inspection and palpation Lungs: clear to auscultation bilaterally Breasts: normal appearance, no masses or tenderness Heart: regular rate and rhythm Abdomen: soft, non-tender; bowel sounds normal; no masses,  no organomegaly Extremities: extremities normal,  atraumatic, no cyanosis or edema Skin: Skin color, texture, turgor normal. No rashes or lesions Lymph nodes: Cervical, supraclavicular, and axillary nodes normal. No abnormal inguinal nodes palpated Neurologic: Grossly normal   Pelvic: External genitalia:  no lesions              Urethra:  normal appearing urethra with no masses, tenderness or lesions              Bartholins and Skenes: normal                 Vagina: normal appearing vagina with normal color and discharge, no lesions              Cervix: no lesions              Pap taken: Yes.   Bimanual Exam:  Uterus:  normal size, contour, position, consistency, mobility, non-tender              Adnexa: normal adnexa and no mass, fullness, tenderness               Rectovaginal: Confirms               Anus:  normal sphincter tone, no lesions  Chaperone was present for exam.  A:  Well Woman with normal exam PMP, No HRT H/O LS&A Decreased libido Hypothyroidism  P: Mammogram yearly.  pap smear today Labs all normal 11/15.  No lab work today. Trial of vagifem 85meq pv twice weekly.  #8/13RF Pt will call if needs RF for Clobetasol and/or topical testosterone return annually or prn

## 2016-01-16 LAB — IPS PAP SMEAR ONLY

## 2016-02-13 ENCOUNTER — Other Ambulatory Visit: Payer: Self-pay | Admitting: Nurse Practitioner

## 2016-02-13 DIAGNOSIS — G4710 Hypersomnia, unspecified: Secondary | ICD-10-CM

## 2016-02-14 MED ORDER — AMPHETAMINE-DEXTROAMPHETAMINE 10 MG PO TABS: 10.0000 mg | ORAL_TABLET | Freq: Every day | ORAL | Status: DC

## 2016-02-14 NOTE — Telephone Encounter (Signed)
Called pt to advise her that her adderall RX is ready for pick up at the front desk. No answer, left a message asking her to call us back. If pt calls back, please advise her of this information.

## 2016-03-20 DIAGNOSIS — Z0001 Encounter for general adult medical examination with abnormal findings: Secondary | ICD-10-CM | POA: Diagnosis not present

## 2016-03-20 DIAGNOSIS — I7300 Raynaud's syndrome without gangrene: Secondary | ICD-10-CM | POA: Diagnosis not present

## 2016-03-20 DIAGNOSIS — M7740 Metatarsalgia, unspecified foot: Secondary | ICD-10-CM | POA: Diagnosis not present

## 2016-03-20 DIAGNOSIS — G4710 Hypersomnia, unspecified: Secondary | ICD-10-CM | POA: Diagnosis not present

## 2016-03-20 DIAGNOSIS — Z79899 Other long term (current) drug therapy: Secondary | ICD-10-CM | POA: Diagnosis not present

## 2016-03-20 DIAGNOSIS — E559 Vitamin D deficiency, unspecified: Secondary | ICD-10-CM | POA: Diagnosis not present

## 2016-03-20 DIAGNOSIS — E039 Hypothyroidism, unspecified: Secondary | ICD-10-CM | POA: Diagnosis not present

## 2016-04-10 DIAGNOSIS — M25512 Pain in left shoulder: Secondary | ICD-10-CM | POA: Diagnosis not present

## 2016-05-03 DIAGNOSIS — M25512 Pain in left shoulder: Secondary | ICD-10-CM | POA: Diagnosis not present

## 2016-05-07 ENCOUNTER — Other Ambulatory Visit: Payer: Self-pay

## 2016-05-07 ENCOUNTER — Other Ambulatory Visit: Payer: Self-pay | Admitting: Neurology

## 2016-05-07 DIAGNOSIS — Z79899 Other long term (current) drug therapy: Secondary | ICD-10-CM | POA: Diagnosis not present

## 2016-05-07 DIAGNOSIS — G4710 Hypersomnia, unspecified: Secondary | ICD-10-CM

## 2016-05-07 DIAGNOSIS — E559 Vitamin D deficiency, unspecified: Secondary | ICD-10-CM | POA: Diagnosis not present

## 2016-05-07 DIAGNOSIS — E039 Hypothyroidism, unspecified: Secondary | ICD-10-CM | POA: Diagnosis not present

## 2016-05-07 NOTE — Telephone Encounter (Signed)
Pt is requesting a refill on adderall via mychart. It is a week early however (last filled 02/14/2016.) I called pt and advised her that the earliest we could fill it would be 05/14/16.Pt verbalized understanding. Will send it to the work in physician on Monday.

## 2016-05-11 DIAGNOSIS — M25512 Pain in left shoulder: Secondary | ICD-10-CM | POA: Diagnosis not present

## 2016-05-14 DIAGNOSIS — M25512 Pain in left shoulder: Secondary | ICD-10-CM | POA: Diagnosis not present

## 2016-05-14 MED ORDER — AMPHETAMINE-DEXTROAMPHETAMINE 10 MG PO TABS: 10.0000 mg | ORAL_TABLET | Freq: Every day | ORAL | Status: DC

## 2016-05-14 NOTE — Telephone Encounter (Signed)
Adderall RX sent to [facility] for review.

## 2016-05-14 NOTE — Telephone Encounter (Signed)
Dr. Jannifer Franklin printed out RX, signed it. I signed it in at front desk. Pt is here to pick it up.

## 2016-05-14 NOTE — Addendum Note (Signed)
Addended by: Lester [place] A on: 05/14/2016 09:06 AM   Modules accepted: Orders

## 2016-06-07 DIAGNOSIS — M25512 Pain in left shoulder: Secondary | ICD-10-CM | POA: Diagnosis not present

## 2016-06-07 DIAGNOSIS — E039 Hypothyroidism, unspecified: Secondary | ICD-10-CM | POA: Diagnosis not present

## 2016-06-07 DIAGNOSIS — M25612 Stiffness of left shoulder, not elsewhere classified: Secondary | ICD-10-CM | POA: Diagnosis not present

## 2016-06-14 DIAGNOSIS — M25512 Pain in left shoulder: Secondary | ICD-10-CM | POA: Diagnosis not present

## 2016-06-14 DIAGNOSIS — M25612 Stiffness of left shoulder, not elsewhere classified: Secondary | ICD-10-CM | POA: Diagnosis not present

## 2016-06-18 DIAGNOSIS — M7742 Metatarsalgia, left foot: Secondary | ICD-10-CM | POA: Diagnosis not present

## 2016-06-18 DIAGNOSIS — M79672 Pain in left foot: Secondary | ICD-10-CM | POA: Diagnosis not present

## 2016-06-21 DIAGNOSIS — M25512 Pain in left shoulder: Secondary | ICD-10-CM | POA: Diagnosis not present

## 2016-06-21 DIAGNOSIS — M25612 Stiffness of left shoulder, not elsewhere classified: Secondary | ICD-10-CM | POA: Diagnosis not present

## 2016-07-12 DIAGNOSIS — M25512 Pain in left shoulder: Secondary | ICD-10-CM | POA: Diagnosis not present

## 2016-07-12 DIAGNOSIS — M25612 Stiffness of left shoulder, not elsewhere classified: Secondary | ICD-10-CM | POA: Diagnosis not present

## 2016-07-30 DIAGNOSIS — E039 Hypothyroidism, unspecified: Secondary | ICD-10-CM | POA: Diagnosis not present

## 2016-08-17 DIAGNOSIS — M7742 Metatarsalgia, left foot: Secondary | ICD-10-CM | POA: Diagnosis not present

## 2016-08-17 DIAGNOSIS — M79672 Pain in left foot: Secondary | ICD-10-CM | POA: Diagnosis not present

## 2016-08-30 DIAGNOSIS — L570 Actinic keratosis: Secondary | ICD-10-CM | POA: Diagnosis not present

## 2016-08-30 DIAGNOSIS — D2262 Melanocytic nevi of left upper limb, including shoulder: Secondary | ICD-10-CM | POA: Diagnosis not present

## 2016-08-30 DIAGNOSIS — L821 Other seborrheic keratosis: Secondary | ICD-10-CM | POA: Diagnosis not present

## 2016-08-30 DIAGNOSIS — D225 Melanocytic nevi of trunk: Secondary | ICD-10-CM | POA: Diagnosis not present

## 2016-08-30 DIAGNOSIS — Z85828 Personal history of other malignant neoplasm of skin: Secondary | ICD-10-CM | POA: Diagnosis not present

## 2016-08-30 DIAGNOSIS — D1801 Hemangioma of skin and subcutaneous tissue: Secondary | ICD-10-CM | POA: Diagnosis not present

## 2016-09-03 ENCOUNTER — Other Ambulatory Visit: Payer: Self-pay | Admitting: Neurology

## 2016-09-03 DIAGNOSIS — G4710 Hypersomnia, unspecified: Secondary | ICD-10-CM

## 2016-09-03 MED ORDER — AMPHETAMINE-DEXTROAMPHETAMINE 10 MG PO TABS: 10.0000 mg | ORAL_TABLET | Freq: Every day | ORAL | 0 refills | Status: DC

## 2016-09-07 DIAGNOSIS — E039 Hypothyroidism, unspecified: Secondary | ICD-10-CM | POA: Diagnosis not present

## 2016-09-11 DIAGNOSIS — Z23 Encounter for immunization: Secondary | ICD-10-CM | POA: Diagnosis not present

## 2016-09-11 DIAGNOSIS — E039 Hypothyroidism, unspecified: Secondary | ICD-10-CM | POA: Diagnosis not present

## 2016-10-23 ENCOUNTER — Ambulatory Visit: Payer: Medicare Other | Admitting: Nurse Practitioner

## 2016-10-25 ENCOUNTER — Encounter: Payer: Self-pay | Admitting: Nurse Practitioner

## 2016-10-25 ENCOUNTER — Ambulatory Visit (INDEPENDENT_AMBULATORY_CARE_PROVIDER_SITE_OTHER): Payer: Medicare Other | Admitting: Nurse Practitioner

## 2016-10-25 VITALS — BP 106/73 | HR 80 | Wt 125.0 lb

## 2016-10-25 DIAGNOSIS — G4710 Hypersomnia, unspecified: Secondary | ICD-10-CM | POA: Diagnosis not present

## 2016-10-25 NOTE — Progress Notes (Signed)
[facility]  PATIENT: Sarah Montoya DOB: 02-Sep-1951   REASON FOR VISIT: Persistent hypersomnia HISTORY FROM: Patient    HISTORY OF PRESENT ILLNESS:Donyetta Jerge is a 65 y.o. female Is seen here in her  yearly RV for excessive daytime sleepiness Tested in March 2009 and followed since that time.She  had already taken Provigil but her Epworth score was endorsed between 0 and 2 points, she had no trouble controlling her urges to sleep on the medication.  As her insurance changed Provigil costs for the patient became prohibitive- This is when she changed to Adderall. She actually takes a half of a tablet and additional half needed. She returns for reevaluation. She has no new complaints    REVIEW OF SYSTEMS: Full 14 system review of systems performed and notable only for those listed, all others are neg:  Constitutional: neg  Cardiovascular: neg Ear/Nose/Throat: neg  Skin: neg Eyes: neg Respiratory: neg Gastroitestinal: neg  Hematology/Lymphatic: neg  Endocrine: neg Musculoskeletal:neg Allergy/Immunology: neg Neurological: neg Psychiatric: neg Sleep : Daytime sleepiness   ALLERGIES: No Known Allergies  HOME MEDICATIONS: Outpatient Medications Prior to Visit  Medication Sig Dispense Refill  . amphetamine-dextroamphetamine (ADDERALL) 10 MG tablet Take 1 tablet (10 mg total) by mouth daily. 90 tablet 0  . cholecalciferol (VITAMIN D) 1000 UNITS tablet Take 1,000 Units by mouth daily.    . clobetasol ointment (TEMOVATE) 0.05 % Apply topically as needed. 30 g 1  . Estradiol 10 MCG TABS vaginal tablet Place 1 tablet (10 mcg total) vaginally 2 (two) times a week. 8 tablet 13  . Lactase (LACTAID PO) Take by mouth as needed.    . NONFORMULARY OR COMPOUNDED ITEM Testosterone 1% in petrolatum 1/4 tsp 2-3 x weekly, Disp 60grams (Patient taking differently: as needed. Testosterone 1% in petrolatum 1/4 tsp 2-3 x weekly, Disp 60grams) 1 each 2  . SUMAtriptan  (IMITREX) 100 MG tablet 100 mg as needed.    Marland Kitchen levothyroxine (SYNTHROID, LEVOTHROID) 75 MCG tablet 75 mcg. Alternates with 50 mcg every other day    . SYNTHROID 50 MCG tablet Pt takes 74mcg every other day, then takes 46mcg alternating days.  0  . CYTOMEL 5 MCG tablet Take 5 mcg by mouth 2 (two) times daily.      No facility-administered medications prior to visit.     PAST MEDICAL HISTORY: Past Medical History:  Diagnosis Date  . Basal cell carcinoma   . Drowsiness    excessive daytime  . Hx of migraines   . Hypersomnia 08/26/2013  . Hypothyroidism   . Hypothyroidism 10/21/2014  . Narcolepsy     PAST SURGICAL HISTORY: Past Surgical History:  Procedure Laterality Date  . MENISCUS REPAIR Right 05/2014    FAMILY HISTORY: Family History  Problem Relation Age of Onset  . Breast cancer Mother 49  . Osteoporosis Mother   . Prostate cancer Father   . Thyroid disease Father   . Mental retardation Sister     SOCIAL HISTORY: Social History   Social History  . Marital status: Married    Spouse name: N/A  . Number of children: 3  . Years of education: college   Occupational History  . instructor     [employer]   Social History Main Topics  . Smoking status: Never Smoker  . Smokeless tobacco: Never Used  . Alcohol use 1.0 - 1.5 oz/week    2 - 3 Standard drinks or equivalent per week     Comment: 4 glasses of wine  weekly  . Drug use: No  . Sexual activity: Yes    Partners: Male    Birth control/ protection: Post-menopausal   Other Topics Concern  . Not on file   Social History Narrative   See previous notes.     PHYSICAL EXAM  Vitals:   10/25/16 1316  BP: 106/73  Pulse: 80  Weight: 125 lb (56.7 kg)   Body mass index is 21.12 kg/m. General: The patient is awake, alert and appears not in acute distress. The patient is well groomed. Head: Normocephalic, atraumatic.  Neck is supple. , No carotid bruit Cardiovascular: Regular rate and rhythm ,  without murmurs  Skin: Without evidence of edema, or rash- she has very cold, hands- Reynaud's.   Neurologic exam : The patient is awake and alert, oriented to place and time. Memory subjective described as intact. There is a normal attention span & concentration ability. Speech is fluent without dysarthria, Mild dysphonia , no Aphasia. Mood and affect are appropriate.  Cranial nerves: Pupils are equal and briskly reactive to light.  Extraocular movements in vertical and horizontal planes intact and without nystagmus. RARE EYEBLINK, Visual fields by finger perimetry are intact. Hearing to finger rub intact. Facial sensation intact to fine touch. Facial motor strength is symmetric and tongue and uvula move midline. Motor exam: Normal muscle bulk and symmetric normal strength in all extremities. There is a slightly general elevated tone throughout upper and lower extremities, a masked face , but no tremor, no cog -wheeling. .  Sensory: Fine touch, pinprick and vibration were tested in all extremities and normal Coordination: Rapid alternating movements in the fingers/hands is tested and appear slowed- Gait and station: Patient walks without assistive device . Strength within normal limits. Stance is stable and normal. Romberg testing is normal.   Deep tendon reflexes: in the upper and lower extremities are symmetric and intact. Babinski maneuver response is downgoing. DIAGNOSTIC DATA (LABS, IMAGING, TESTING) -  ASSESSMENT AND PLAN  65 y.o. year old female  has a past medical history of  Hypersomnia (08/26/2013); here to follow-up. She is currently treated with Adderall.  Continue Adderall at current dose does not need refills had 3 month refill in mid October  Follow-up yearly and when necessary Next visit with Dr. De Nurse, [facility], [facility], APRN  [facility] [address], [place], [place] [address] [phone]

## 2016-10-25 NOTE — Patient Instructions (Signed)
Continue Adderall at current dose does not need refills  Follow-up yearly and when necessary Next visit with Dr. Brett Fairy

## 2016-10-25 NOTE — Progress Notes (Signed)
I agree with the assessment and plan as directed by NP .The patient is known to me .   Baley Lorimer, MD  

## 2016-10-28 ENCOUNTER — Other Ambulatory Visit: Payer: Self-pay | Admitting: Obstetrics & Gynecology

## 2016-10-29 NOTE — Telephone Encounter (Signed)
Medication refill request: Estradiol 0.1 mg/gm Last AEX:  01/13/16 SM Next AEX: 05/03/17 SM Last MMG (if hormonal medication request): 12/09/15 [facility] Refill authorized: 01/13/16 #42.5g 2R. Please advise. Thank you.

## 2016-12-04 ENCOUNTER — Other Ambulatory Visit: Payer: Self-pay | Admitting: Neurology

## 2016-12-04 DIAGNOSIS — G4710 Hypersomnia, unspecified: Secondary | ICD-10-CM

## 2016-12-04 MED ORDER — AMPHETAMINE-DEXTROAMPHETAMINE 10 MG PO TABS: 10.0000 mg | ORAL_TABLET | Freq: Every day | ORAL | 0 refills | Status: DC

## 2017-01-31 ENCOUNTER — Other Ambulatory Visit: Payer: Self-pay | Admitting: Obstetrics & Gynecology

## 2017-01-31 NOTE — Telephone Encounter (Signed)
Medication refill request: Estrace  Last AEX:  01-13-16  Next AEX: 05-03-17  Last MMG (if hormonal medication request): 12-09-15 WNL  Refill authorized: please advise

## 2017-02-05 ENCOUNTER — Ambulatory Visit: Payer: Medicare Other | Admitting: Sports Medicine

## 2017-02-14 DIAGNOSIS — Z1211 Encounter for screening for malignant neoplasm of colon: Secondary | ICD-10-CM | POA: Diagnosis not present

## 2017-02-21 ENCOUNTER — Encounter: Payer: Self-pay | Admitting: Sports Medicine

## 2017-02-21 ENCOUNTER — Ambulatory Visit (INDEPENDENT_AMBULATORY_CARE_PROVIDER_SITE_OTHER): Payer: Medicare Other | Admitting: Sports Medicine

## 2017-02-21 DIAGNOSIS — G5752 Tarsal tunnel syndrome, left lower limb: Secondary | ICD-10-CM

## 2017-02-21 DIAGNOSIS — G5750 Tarsal tunnel syndrome, unspecified lower limb: Secondary | ICD-10-CM | POA: Insufficient documentation

## 2017-02-21 NOTE — Assessment & Plan Note (Signed)
-   Suspect increased pain of left foot is due to vascular hyperreactivity - Will remove metatarsal pad from left orthotic, as cavus foot somewhat improved - Recommend taking vitamin B6 100 mg daily (though patient says she may be taking this at home) - If already taking B6, would start amitriptyline 25 mg QHS to help with nerve pain - Also recommended warm foot baths to improve circulation - Evaluated patient's shoes and recommended continuing use of shoes with wider toebox

## 2017-02-21 NOTE — Progress Notes (Signed)
[facility] Progress Note  Subjective:  Sarah Montoya is a 66 y.o. female with history of  metatarsalgia who presents for ongoing L foot pain. She had orthotics made in 2012 and again in 2014 and has had ongoing irritation/pain of L foot despite metatarsal pad for collapsing arch, skipping a hole for her laces to relieve pressure. Her activities include aerobics classes, during which she feels she has less "bounce" of L foot, and walking 3-4 miles daily, after which the L sole of her foot feels like it is burning--especially over ball of foot--and has pain/numbness of L front toe she thinks due to it sliding forward. She also notes foot swelling. She is concerned the orthotics fit differently in her different shoes. She has also tried a toe separator, as she has been told she had L hammer toe. Pain, however, goes away as soon as she removes her shoes. Does not know if cold weather makes it worse  ROS: No falls, no rashes, no LBP or sciatica  No Known Allergies  Objective: Blood pressure 130/82, pulse 97, height 5\' 4"  (1.626 m), weight 125 lb (56.7 kg), last menstrual period 07/03/2008. Body mass index is 21.46 kg/m. Constitutional: Thin, well-appearing female in NAD Pulmonary/Chest: No respiratory distress.   Musculoskeletal: No pain with dorsi or plantar flexion or with inversion or eversion bilaterally. High arches of bilateral feet. Increased spacing between R 2nd and 3rd toes.  Neurological: Strength 5/5 of LEs and sensation intact. Placing pressure on tibial nerve behind medial malleolus did elicit some numbness in L foot, as well as R foot to lesser degree. Negative squeeze test for Morton's neuroma.  Bilateral cavus feet Skin: Blanchable hyperpigmentation of hands and feet to wrists and ankles respectively.  Psychiatric: Flat affect.  Vitals reviewed  Assessment/Plan: Tarsal tunnel syndrome - Suspect increased pain of left foot is due to vascular hyperreactivity -  Will remove metatarsal pad from left orthotic, as cavus foot somewhat improved - Recommend taking vitamin B6 100 mg daily (though patient says she may be taking this at home) - If already taking B6, would start amitriptyline 25 mg QHS to help with nerve pain - Also recommended warm foot baths to improve circulation - Evaluated patient's shoes and recommended continuing use of shoes with wider toebox  Follow-up in about 1 month to assess for improvement with taking supplements.  Olene Floss, MD [place], PGY-2  I observed and examined the patient with the resident and agree with assessment and plan.  Note reviewed and modified by me. Stefanie Libel, MD

## 2017-02-26 ENCOUNTER — Other Ambulatory Visit: Payer: Self-pay | Admitting: Obstetrics & Gynecology

## 2017-02-26 DIAGNOSIS — Z1231 Encounter for screening mammogram for malignant neoplasm of breast: Secondary | ICD-10-CM

## 2017-03-06 ENCOUNTER — Other Ambulatory Visit: Payer: Self-pay | Admitting: Neurology

## 2017-03-06 DIAGNOSIS — G4710 Hypersomnia, unspecified: Secondary | ICD-10-CM

## 2017-03-07 MED ORDER — AMPHETAMINE-DEXTROAMPHETAMINE 10 MG PO TABS: 10.0000 mg | ORAL_TABLET | Freq: Every day | ORAL | 0 refills | Status: DC

## 2017-03-07 NOTE — Telephone Encounter (Signed)
rx for adderall 10 mg once daily renewed for 30 d.

## 2017-03-18 ENCOUNTER — Ambulatory Visit
Admission: RE | Admit: 2017-03-18 | Discharge: 2017-03-18 | Disposition: A | Discharge status: Home / Self Care | Payer: Medicare Other | Source: Ambulatory Visit | Attending: Obstetrics & Gynecology | Admitting: Obstetrics & Gynecology

## 2017-03-18 DIAGNOSIS — Z1231 Encounter for screening mammogram for malignant neoplasm of breast: Secondary | ICD-10-CM

## 2017-04-09 DIAGNOSIS — Z23 Encounter for immunization: Secondary | ICD-10-CM | POA: Diagnosis not present

## 2017-04-09 DIAGNOSIS — Z1389 Encounter for screening for other disorder: Secondary | ICD-10-CM | POA: Diagnosis not present

## 2017-04-09 DIAGNOSIS — M7741 Metatarsalgia, right foot: Secondary | ICD-10-CM | POA: Diagnosis not present

## 2017-04-09 DIAGNOSIS — E039 Hypothyroidism, unspecified: Secondary | ICD-10-CM | POA: Diagnosis not present

## 2017-04-09 DIAGNOSIS — Z0000 Encounter for general adult medical examination without abnormal findings: Secondary | ICD-10-CM | POA: Diagnosis not present

## 2017-04-09 DIAGNOSIS — G43909 Migraine, unspecified, not intractable, without status migrainosus: Secondary | ICD-10-CM | POA: Diagnosis not present

## 2017-04-09 DIAGNOSIS — R6889 Other general symptoms and signs: Secondary | ICD-10-CM | POA: Diagnosis not present

## 2017-04-09 DIAGNOSIS — E559 Vitamin D deficiency, unspecified: Secondary | ICD-10-CM | POA: Diagnosis not present

## 2017-04-09 DIAGNOSIS — R258 Other abnormal involuntary movements: Secondary | ICD-10-CM | POA: Diagnosis not present

## 2017-04-09 DIAGNOSIS — G4710 Hypersomnia, unspecified: Secondary | ICD-10-CM | POA: Diagnosis not present

## 2017-04-11 ENCOUNTER — Other Ambulatory Visit: Payer: Self-pay | Admitting: Neurology

## 2017-04-11 ENCOUNTER — Telehealth: Payer: Self-pay

## 2017-04-11 DIAGNOSIS — G4710 Hypersomnia, unspecified: Secondary | ICD-10-CM

## 2017-04-11 MED ORDER — AMPHETAMINE-DEXTROAMPHETAMINE 10 MG PO TABS: 10.0000 mg | ORAL_TABLET | Freq: Every day | ORAL | 0 refills | Status: DC

## 2017-04-11 NOTE — Telephone Encounter (Signed)
Refill request sent through [facility]. Rx due, patient up to date on appts. I have printed and placed on Kristen's desk for signature.

## 2017-04-17 ENCOUNTER — Other Ambulatory Visit: Payer: Self-pay | Admitting: Neurology

## 2017-04-17 DIAGNOSIS — G4710 Hypersomnia, unspecified: Secondary | ICD-10-CM

## 2017-05-03 ENCOUNTER — Encounter: Payer: Self-pay | Admitting: Obstetrics & Gynecology

## 2017-05-03 ENCOUNTER — Ambulatory Visit (INDEPENDENT_AMBULATORY_CARE_PROVIDER_SITE_OTHER): Payer: Medicare Other | Admitting: Obstetrics & Gynecology

## 2017-05-03 VITALS — BP 112/84 | HR 88 | Resp 14 | Ht 64.5 in | Wt 124.0 lb

## 2017-05-03 DIAGNOSIS — Z124 Encounter for screening for malignant neoplasm of cervix: Secondary | ICD-10-CM | POA: Diagnosis not present

## 2017-05-03 DIAGNOSIS — E2839 Other primary ovarian failure: Secondary | ICD-10-CM | POA: Diagnosis not present

## 2017-05-03 DIAGNOSIS — Z01419 Encounter for gynecological examination (general) (routine) without abnormal findings: Secondary | ICD-10-CM

## 2017-05-03 DIAGNOSIS — M8580 Other specified disorders of bone density and structure, unspecified site: Secondary | ICD-10-CM | POA: Diagnosis not present

## 2017-05-03 MED ORDER — CLOBETASOL PROPIONATE 0.05 % EX OINT: TOPICAL_OINTMENT | CUTANEOUS | 1 refills | Status: DC

## 2017-05-03 NOTE — Patient Instructions (Signed)
Plan bone density testing next year with your mammogram.  Order has been [facility] to [facility].

## 2017-05-03 NOTE — Progress Notes (Signed)
66 y.o. G3P3 MarriedCaucasianF here for annual exam.  Doing well.  No vaginal bleeding.  Continues to have libido issues.  Frustrated with cost of Estradiol cream.  Would like to continue this but states she can't with the cost.  Reviewed options.    She's also like to continue with the topical testosterone as she felt this did help when she was using three times weekly.  However, testosterone level was much higher with this dosage.  She decreased to once daily and this really didn't help that much.  So, she just stopped.  D/w pt dosage adjustments that are possible and plan to check testosterone level in 3 months if symptoms improve.  Risks and benefits reviewed including DVT and breast cancer.    PCP:  Dr. Inda Merlin  Patient's last menstrual period was 07/03/2008.          Sexually active: Yes.    The current method of family planning is post menopausal status.    Exercising: Yes.    swimming, gym Smoker:  no  Health Maintenance: Pap:  01/13/16 Neg. HR HPV:neg   09/21/14 Neg  History of abnormal Pap:  no MMG:  03/18/17 BIRADS1:neg  Colonoscopy: 02/2017 Normal. No f/u needed  BMD:   07/28/13 Mild Osteopenia  TDaP:  05/2012 Pneumonia vaccine(s):  07/2015 Zostavax:   2013 Hep C testing: w/ PCP Screening Labs: PCP   reports that she has never smoked. She has never used smokeless tobacco. She reports that she drinks about 1.0 - 1.5 oz of alcohol per week . She reports that she does not use drugs.  Past Medical History:  Diagnosis Date  . Basal cell carcinoma   . Drowsiness    excessive daytime  . Hx of migraines   . Hypersomnia 08/26/2013  . Hypothyroidism   . Hypothyroidism 10/21/2014  . Narcolepsy     Past Surgical History:  Procedure Laterality Date  . MENISCUS REPAIR Right 05/2014    Current Outpatient Prescriptions  Medication Sig Dispense Refill  . amphetamine-dextroamphetamine (ADDERALL) 10 MG tablet Take 1 tablet (10 mg total) by mouth daily. 30 tablet 0  . cholecalciferol  (VITAMIN D) 1000 UNITS tablet Take 1,000 Units by mouth daily.    . Lactase (LACTAID PO) Take by mouth as needed.    Marland Kitchen levothyroxine (SYNTHROID, LEVOTHROID) 88 MCG tablet 88 mcg daily.    . meloxicam (MOBIC) 15 MG tablet Take 15 mg by mouth daily as needed.     . SUMAtriptan (IMITREX) 100 MG tablet 100 mg as needed.     No current facility-administered medications for this visit.     Family History  Problem Relation Age of Onset  . Breast cancer Mother 52  . Osteoporosis Mother   . Prostate cancer Father   . Thyroid disease Father   . Mental retardation Sister     ROS:  Pertinent items are noted in HPI.  Otherwise, a comprehensive ROS was negative.  Exam:   BP 112/84 (BP Location: Right Arm, Patient Position: Sitting, Cuff Size: Normal)   Pulse 88   Resp 14   Ht 5' 4.5" (1.638 m)   Wt 124 lb (56.2 kg)   LMP 07/03/2008   BMI 20.96 kg/m    Height: 5' 4.5" (163.8 cm)  Ht Readings from Last 3 Encounters:  05/03/17 5' 4.5" (1.638 m)  02/21/17 5\' 4"  (1.626 m)  01/13/16 5' 4.5" (1.638 m)    General appearance: alert, cooperative and appears stated age Head: Normocephalic, without obvious abnormality,  atraumatic Neck: no adenopathy, supple, symmetrical, trachea midline and thyroid normal to inspection and palpation Lungs: clear to auscultation bilaterally Breasts: normal appearance, no masses or tenderness Heart: regular rate and rhythm Abdomen: soft, non-tender; bowel sounds normal; no masses,  no organomegaly Extremities: extremities normal, atraumatic, no cyanosis or edema Skin: Skin color, texture, turgor normal. No rashes or lesions Lymph nodes: Cervical, supraclavicular, and axillary nodes normal. No abnormal inguinal nodes palpated Neurologic: Grossly normal   Pelvic: External genitalia:  no lesions              Urethra:  normal appearing urethra with no masses, tenderness or lesions              Bartholins and Skenes: normal                 Vagina: normal appearing  vagina with normal color and discharge, no lesions              Cervix: no lesions              Pap taken: No. Bimanual Exam:  Uterus:  normal size, contour, position, consistency, mobility, non-tender              Adnexa: normal adnexa and no mass, fullness, tenderness               Rectovaginal: Confirms               Anus:  normal sphincter tone, no lesions  Chaperone was present for exam.  A:  Well Woman with normal exam PMP, no HRT H/O LS&A Decreased libido Hypothyroidism Migraine without aura  P:   Mammogram guidelines reviewed pap smear not indicated today Plan BMD with MMG net year.  Order placed.   Estradiol cream and topical testosterone will be called to [facility].  Follow-up will be planned depending on results. Return annually or prn

## 2017-05-13 ENCOUNTER — Other Ambulatory Visit: Payer: Self-pay | Admitting: Obstetrics & Gynecology

## 2017-05-13 ENCOUNTER — Telehealth: Payer: Self-pay | Admitting: Obstetrics & Gynecology

## 2017-05-13 DIAGNOSIS — R6882 Decreased libido: Secondary | ICD-10-CM

## 2017-05-13 MED ORDER — NONFORMULARY OR COMPOUNDED ITEM: 4 refills | Status: DC

## 2017-05-13 NOTE — Telephone Encounter (Signed)
Can you please apologize to her for me.  When I went to call in the rx last week, I realized this could be done together--the estrogen and testosterone.  I meant to call her and ask if she wants these compounded together or separately.  Please confirm.  I can always do it together for the next three months and if the testosterone doesn't help that much, I can have it removed from the cream, just FYI.  Thanks.

## 2017-05-13 NOTE — Telephone Encounter (Signed)
[facility] called stating the patient said they should have two prescriptions for her but they do not.

## 2017-05-13 NOTE — Telephone Encounter (Signed)
Spoke with Gwinda Passe at [facility]. Was advised patient was waiting for RX for estradiol cream and topical testosterone cream, not received to date. Advised will review with Dr. Sabra Heck and return call.   Dr. Sabra Heck, please advise on topical testosterone cream and estradiol cream from [facility] dated 05/03/17?

## 2017-05-14 MED ORDER — NONFORMULARY OR COMPOUNDED ITEM: 1 refills | Status: DC

## 2017-05-14 MED ORDER — NONFORMULARY OR COMPOUNDED ITEM: 0 refills | Status: DC

## 2017-05-14 NOTE — Telephone Encounter (Signed)
Spoke with patient, advised as seen below per Dr. Sabra Heck. Patient states she was under the impression from conversation that testosterone would be used 3 times per week and estrogen 2 times per week. Patient states she would prefer to go with what is best, would like Dr. Sabra Heck to advise on if they should be together or separate. Advised patient would update Dr. Sabra Heck and return call with recommendations, [facility] verified.   Dr. Sabra Heck, please review and advise?

## 2017-05-14 NOTE — Telephone Encounter (Signed)
Rx done for estradiol vaginal cream to use pv three times weekly.  The topical testosterone is to be used topically twice weekly.  She needs a repeat testosterone level in 3 months.  Ok to order a total testosterone level.  Rx's are signed and on your desk.  [facility].  Thanks.

## 2017-05-14 NOTE — Telephone Encounter (Signed)
Patient returned call to nurse. She has questions about the compounding.

## 2017-05-14 NOTE — Telephone Encounter (Signed)
Left message to call Sary Bogie at [phone].  

## 2017-05-15 NOTE — Telephone Encounter (Signed)
Spoke with patient, advised as seen below per Dr. Sabra Heck. Patient scheduled for labs on 08/16/17 at 1:30pm. Patient thankful for assistance, verbalizes understanding and is agreeable.  Order placed for total testosterone lab.  Routing to provider for final review. Patient is agreeable to disposition. Will close encounter.

## 2017-05-15 NOTE — Telephone Encounter (Signed)
RX faxed to [facility].   Call to patient. Left message to call Sharee Pimple at [phone].

## 2017-05-15 NOTE — Addendum Note (Signed)
Addended by: Burnice Logan on: 05/15/2017 09:22 AM   Modules accepted: Orders

## 2017-06-10 ENCOUNTER — Institutional Professional Consult (permissible substitution): Payer: Medicare Other | Admitting: Neurology

## 2017-06-11 ENCOUNTER — Encounter: Payer: Self-pay | Admitting: Neurology

## 2017-06-12 ENCOUNTER — Ambulatory Visit (INDEPENDENT_AMBULATORY_CARE_PROVIDER_SITE_OTHER): Payer: Medicare Other | Admitting: Neurology

## 2017-06-12 ENCOUNTER — Encounter: Payer: Self-pay | Admitting: Neurology

## 2017-06-12 VITALS — BP 137/97 | HR 84 | Ht 65.0 in | Wt 126.0 lb

## 2017-06-12 DIAGNOSIS — G20A1 Parkinson's disease without dyskinesia, without mention of fluctuations: Secondary | ICD-10-CM

## 2017-06-12 DIAGNOSIS — I7300 Raynaud's syndrome without gangrene: Secondary | ICD-10-CM | POA: Diagnosis not present

## 2017-06-12 DIAGNOSIS — G4710 Hypersomnia, unspecified: Secondary | ICD-10-CM

## 2017-06-12 DIAGNOSIS — G20 Parkinson's disease: Secondary | ICD-10-CM | POA: Diagnosis not present

## 2017-06-12 DIAGNOSIS — R258 Other abnormal involuntary movements: Secondary | ICD-10-CM | POA: Insufficient documentation

## 2017-06-12 DIAGNOSIS — R490 Dysphonia: Secondary | ICD-10-CM

## 2017-06-12 DIAGNOSIS — M6289 Other specified disorders of muscle: Secondary | ICD-10-CM | POA: Insufficient documentation

## 2017-06-12 MED ORDER — AMPHETAMINE-DEXTROAMPHETAMINE 10 MG PO TABS: 10.0000 mg | ORAL_TABLET | Freq: Every day | ORAL | 0 refills | Status: DC

## 2017-06-12 NOTE — Progress Notes (Addendum)
[facility]   Provider:  Larey Montoya, M D  Primary Care Physician:  Sarah Huddle, MD   Referring Provider: Josetta Huddle, MD   Chief Complaint  Patient presents with  . New Patient (Initial Visit)    you see this pt for sleepiness, presents today with probable brdaykinesia    HPI:  Sarah Montoya is a 66 y.o. female , seen here as in a referral  from Dr. Inda Montoya for  Sarah Montoya  I have followed Sarah Montoya since December 2013 for hypersomnia and had worked her up for possible sleep apnea. In December 2016 and 2017 she followed up with my nurse practitioner Sarah Montoya. She reports that her primary care physician, Dr. Josetta Montoya, so-called walking outside the office and had noted that she walked slowly. In addition she has a very hoarse voice and she does have a reduced facial expression, rare blink. She has an astonished facial expression.   She carries the following diagnoses, hypothyroidism, migraine headaches, basal cell cancer of the nose surgically removed in August 2014 by Dr. Rolm Montoya. Left shoulder pain followed by Dr. Flossie Montoya, and she is followed for sports medicine by Dr. Oneida Montoya.  Current medications Adderall for hypersomnia, L-thyroxine, sumatriptan as needed,  Social history:   the patient social history she has never used tobacco products she uses rarely on occasion alcohol, has no history of regression or drug use, she is married has 3 healthy children, she works as a Financial risk analyst, she has been drinking coffee but only in the morning, she quit afternoon coffee due to headaches are provoked.   Chief complaint according to patient : " My doctor found me walking funny"   Review of Systems: Out of a complete 14 system review, the patient complains of only the following symptoms, and all other reviewed systems are negative. EDS controlled under adderall, takes it prn for driving, for conferences, tests etc.   Lost arm swing in the left  arm- attributed to shoulder injury, still has PT>  Thumb with tremor.   Anxiety/  depression score n/a    Social History   Social History  . Marital status: Married    Spouse name: N/A  . Number of children: 3  . Years of education: college   Occupational History  . instructor     [employer]   Social History Main Topics  . Smoking status: Never Smoker  . Smokeless tobacco: Never Used  . Alcohol use 1.0 - 1.5 oz/week    2 - 3 Standard drinks or equivalent per week     Comment: 4 glasses of wine weekly  . Drug use: No  . Sexual activity: Yes    Partners: Male    Birth control/ protection: Post-menopausal   Other Topics Concern  . Not on file   Social History Narrative   See previous notes.    Family History  Problem Relation Age of Onset  . Breast cancer Mother 56  . Osteoporosis Mother   . Prostate cancer Father   . Thyroid disease Father   . Mental retardation Sister     Past Medical History:  Diagnosis Date  . Basal cell carcinoma   . Drowsiness    excessive daytime  . Hx of migraines   . Hypersomnia 08/26/2013  . Hypothyroidism   . Hypothyroidism 10/21/2014  . Narcolepsy     Past Surgical History:  Procedure Laterality Date  . MENISCUS REPAIR Right 05/2014    Current Outpatient Prescriptions  Medication Sig Dispense Refill  . amphetamine-dextroamphetamine (ADDERALL) 10 MG tablet Take 1 tablet (10 mg total) by mouth daily. 30 tablet 0  . cholecalciferol (VITAMIN D) 1000 UNITS tablet Take 1,000 Units by mouth daily.    . clobetasol ointment (TEMOVATE) 0.05 % Apply topically twice daily for no more than 7 days. 45 g 1  . Lactase (LACTAID PO) Take by mouth as needed.    Marland Kitchen levothyroxine (SYNTHROID, LEVOTHROID) 88 MCG tablet 88 mcg daily.    . meloxicam (MOBIC) 15 MG tablet Take 15 mg by mouth daily as needed.     . NONFORMULARY OR COMPOUNDED ITEM Estradiol 0.02% in 42ml prefilled syringes.  One syringe pv two to three times weekly. 36 each 0  .  NONFORMULARY OR COMPOUNDED ITEM Topical testosterone cream 1mg / 0.60ml.  Apply 0.54ml topically two times weekly. #45month supply/1RF 1 each 1  . SUMAtriptan (IMITREX) 100 MG tablet 100 mg as needed.     No current facility-administered medications for this visit.     Allergies as of 06/12/2017 - Review Complete 06/12/2017  Allergen Reaction Noted  . Ciprofloxacin Diarrhea 06/11/2017  . Codeine Nausea Only 06/11/2017    Vitals: BP (!) 137/97   Pulse 84   Ht 5\' 5"  (1.651 m)   Wt 126 lb (57.2 kg)   LMP 07/03/2008   BMI 20.97 kg/m  Last Weight:  Wt Readings from Last 1 Encounters:  06/12/17 126 lb (57.2 kg)   ZOX:WRUE mass index is 20.97 kg/m.     Last Height:   Ht Readings from Last 1 Encounters:  06/12/17 5\' 5"  (1.651 m)    Physical exam:  General: The patient is awake, alert and appears not in acute distress. The patient is well groomed. Head: Normocephalic, atraumatic. Neck is supple.Retrognathia is seen.  Cardiovascular:  Regular rate and rhythm, without  murmurs or carotid bruit, and without distended neck veins. Respiratory: Lungs are clear to auscultation. Skin:  Without evidence of edema, -hands and feet are bluish discoloured, she is cold intolerant. Has low BP. MSA?   Trunk: BMI is 21. The patient's posture is erect , not stooped.   Neurologic exam : The patient is awake and alert, oriented to place and time.   Memory subjective described as intact.     Attention span & concentration ability appears normal.  Speech with dysphonia , not  aphasia.  Mood and affect are anxious . Cranial nerves: Pupils are equal and briskly reactive to light. Funduscopic exam without  evidence of pallor or edema. Extraocular movements  in vertical and horizontal planes intact and without nystagmus. Visual fields by finger perimetry are intact. The facial expression is frozen- almost astonished, in disbelief. Wide open eyes.  Hearing to finger rub intact.  Facial sensation intact to  fine touch. Facial motor strength is symmetric and tongue and uvula move midline. Shoulder shrug was symmetrical.   Motor exam: increased biceps tone, with cog wheeling.  muscle bulk is symmetric and less strength in left hand. She noticed when typing.  She is Dominantly right handed. Her handwriting has not changed.  Coordination: Rapid alternating movements in the fingers/hands was normal. Finger-to-nose maneuver  normal without evidence of ataxia, dysmetria or tremor. Gait and station: Patient walks without assistive device and is able unassisted to climb up to the exam table. Strength within normal limits.  Stance is stable and normal.  Tandem gait is unfragmented. Turns with  3 Steps. Lack of left hand armswing, keeps thumb and index finger  tight together -  Sensory:  Proprioception tested in the upper extremities was normal. hands and feet are bluish discoloured, she is cold intolerant. Has low BP.   Deep tendon reflexes: in the  upper and lower extremities are symmetric and intact. Babinski maneuver response is  downgoing.    Assessment:  After physical and neurologic examination, review of laboratory studies,  Personal review of imaging studies, reports of other /same  Imaging studies, results of polysomnography and / or neurophysiology testing and pre-existing records as far as provided in visit., my assessment is   1)  [name] is a noticeable absent left-sided arm swing when walking, she has noticed a tremor in her left thumb. She can provide normal arm swing if she walks and holds on to a squeeze ball. She does not have sign of shuffling and she turns with 3 steps, walks without assistive device. Dr Rodena Medin confirmed constricted movements in left arm due to an exercise injury. She has neither fallen nor reporting any RLS symptoms or REM BD.   I cannot attribute all the findings including cogwheeling, biceps rigidity on post upper extremities and the loss of arm swing on the left only  to her shoulder injury. I am concerned that her facial mimic has changed, I am also concerned that she reports cold hand and feet bluish discoloration and low blood pressures. I would like her to be worked up for the following conditions  #1 MRI to rule out any vascular injury to the dopaminergic brain region. #2 evaluate on revisit with orthostatic blood pressures, consider multi system atrophy, a condition that usually comes with bluish discoloration of toes and lower extremities and can also lead to fainting. #3 differentiate increased muscle tone from dystonia. She does not have writer's dystonia, task specific. But she reports the keeping a  squeezeball in her left hand helps her arm swing.    The patient was advised of the nature of the diagnosed disorder , the treatment options and the  risks for general health and wellness arising from not treating the condition.   I spent more than 50 minutes of face to face time with the patient.  Greater than 50% of time was spent in counseling and coordination of care. We have discussed the diagnosis and differential and I answered the patient's questions.    Plan:  Treatment plan and additional workup :    if [employer] allows : DAT Scan at [facility] ? If not approving send to  [facility] Sinemet 25/100 mg in AM and at lunch is plan B if we see nothing definitive on MRI/ SCAN  . Orthostatic BP next visit in 4-6 weeks.    Sarah Seat, MD 4/74/2595, 6:38 PM  Certified in Neurology by ABPN Certified in [place] by [facility] [address] [place], [place] [address]

## 2017-06-13 ENCOUNTER — Telehealth: Payer: Self-pay | Admitting: Neurology

## 2017-06-13 NOTE — Telephone Encounter (Signed)
Called and left patient a message relaying. MRI [facility] will call her to schedule. [phone] DAT Scan - [facility] will call her to schedule and give her apt with in next couple of weeks. [phone].

## 2017-06-13 NOTE — Telephone Encounter (Signed)
Pt is asking for a call back.  She received a call to schedule her MRI and wants to make sure that it is to be done before the Nuclear medical test, please call

## 2017-06-14 ENCOUNTER — Telehealth: Payer: Self-pay | Admitting: Radiology

## 2017-06-16 ENCOUNTER — Encounter: Payer: Self-pay | Admitting: Neurology

## 2017-06-28 ENCOUNTER — Encounter: Payer: Self-pay | Admitting: Neurology

## 2017-07-01 ENCOUNTER — Encounter: Payer: Self-pay | Admitting: Neurology

## 2017-07-04 ENCOUNTER — Encounter: Payer: Self-pay | Admitting: Neurology

## 2017-07-04 ENCOUNTER — Ambulatory Visit (HOSPITAL_COMMUNITY)
Admission: RE | Admit: 2017-07-04 | Discharge: 2017-07-04 | Disposition: A | Discharge status: Home / Self Care | Payer: Medicare Other | Source: Ambulatory Visit | Attending: Neurology | Admitting: Neurology

## 2017-07-04 ENCOUNTER — Encounter (HOSPITAL_COMMUNITY)
Admission: RE | Admit: 2017-07-04 | Discharge: 2017-07-04 | Disposition: A | Discharge status: Home / Self Care | Payer: Medicare Other | Source: Ambulatory Visit | Attending: Neurology | Admitting: Neurology

## 2017-07-04 DIAGNOSIS — G20 Parkinson's disease: Secondary | ICD-10-CM | POA: Diagnosis not present

## 2017-07-04 MED ORDER — IODINE STRONG (LUGOLS) 5 % PO SOLN
0.0800 mL | Freq: Once | ORAL | Status: AC
  Administered 2017-07-04: 0.08 mL via ORAL
  Filled 2017-07-04: qty 0.08

## 2017-07-04 MED ORDER — IOFLUPANE I 123 185 MBQ/2.5ML IV SOLN
4.5000 | Freq: Once | INTRAVENOUS | Status: AC
  Administered 2017-07-04: 4.5 via INTRAVENOUS

## 2017-07-09 ENCOUNTER — Telehealth: Payer: Self-pay | Admitting: Neurology

## 2017-07-09 ENCOUNTER — Encounter: Payer: Self-pay | Admitting: Neurology

## 2017-07-09 NOTE — Telephone Encounter (Signed)
Called patient as she was on wait list. I had two opening on Thursday and was going to offer those a slot to her to get her in sooner as she had some concerns regarding her DAT scan. If patient calls back she can have either slot available on Thur 8/23 that I have on hold.

## 2017-07-09 NOTE — Telephone Encounter (Signed)
Pt returned RN's call. An appt is scheduled for 8/23 @ 11.

## 2017-07-11 ENCOUNTER — Encounter: Payer: Self-pay | Admitting: Neurology

## 2017-07-11 ENCOUNTER — Ambulatory Visit (INDEPENDENT_AMBULATORY_CARE_PROVIDER_SITE_OTHER): Payer: Medicare Other | Admitting: Neurology

## 2017-07-11 VITALS — BP 124/87 | HR 79 | Ht 65.0 in | Wt 127.0 lb

## 2017-07-11 DIAGNOSIS — G20 Parkinson's disease: Secondary | ICD-10-CM | POA: Diagnosis not present

## 2017-07-11 MED ORDER — AMPHETAMINE-DEXTROAMPHETAMINE 10 MG PO TABS: 10.0000 mg | ORAL_TABLET | Freq: Every day | ORAL | 0 refills | Status: DC

## 2017-07-11 MED ORDER — CARBIDOPA-LEVODOPA 25-100 MG PO TABS: 1.0000 | ORAL_TABLET | Freq: Two times a day (BID) | ORAL | 0 refills | Status: DC

## 2017-07-11 NOTE — Progress Notes (Addendum)
S[facility]INE [facility]   Provider:  Larey Seat, M D  Primary Care Physician:  Sarah Huddle, MD   Referring Provider: Josetta Huddle, MD   Chief Complaint  Patient presents with  . Follow-up    pt saw her DAT scan report and was wanting to get in sooner to be seen. since we had a cancellation for this time i was able to get the patient in so that she can discuss her DAT scane and treatment.    HPI:  Sarah Montoya is a 66 y.o. female , seen here as in a referral  from Sarah Montoya for  BRADYKINESIA.  Interval history from 07/11/2017. I have the opportunity today to meet with Sarah Montoya and discuss her recent CAT scan which was performed at [facility] on 07/04/2017. 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 The results of her study showed that the patient had decreased accumulation of the radiotracer worse on the right in comparison to the left brain." Tiffiny Worthy and color date are mostly affected Bowles have some suggested loss of dopamine transportation, this is a feature seen in Parkinson's type syndrome, and would manifest usually as left-sided rigor or tremor.  I have discussed with Sarah Montoya that I would like to place her on classic Sinemet 3 times a day 100/25 mg to see if she feels that her movements are responding that she has more fluidity. The patient does not have primary tremor. I also entertained the possibility that she has multisystem atrophy. She reports not to have fallen or fainted. She has no RLS. No vivid dreams,   She is hesitant to try medication. She would rather try an exercise regimen to see if her bradykinesia responds and she is open to visit with movement specialists Dr. Rexene Alberts or Dr. Carles Collet  for a second opinion. She agreed to a 30 day trial of medication, 100-25 mg and agreed to twice a day instead of TID.      I have followed Sarah Montoya since December 2013 for hypersomnia and had worked her up for possible sleep apnea. In December 2016 and 2017 she followed up with my nurse practitioner Sarah Montoya. She reports that her primary care physician, Sarah Montoya, so-called walking outside the office and had noted that she walked slowly. In addition she has a very hoarse voice and she does have a reduced facial expression, rare blink. She has an astonished facial expression.   She carries the following diagnoses, hypothyroidism, migraine headaches, basal cell cancer of the nose surgically removed in August 2014 by Dr. Rolm Bookbinder. Left shoulder pain followed by Dr. Flossie Dibble, and she is followed for sports medicine by Dr. Oneida Alar. Current medications Adderall for hypersomnia, L-thyroxine, sumatriptan as needed,  Social history:  the patient social history she has never used tobacco products she uses rarely on occasion alcohol, has no history of regression or drug use, she is married has 3 healthy children, she works as a Financial risk analyst, she has been drinking coffee but only in the morning, she quit afternoon coffee due to headaches are provoked.  Chief complaint according to patient : " My doctor found me walking funny"  Review of Systems: Out of a complete 14 system review, the patient complains of only the following symptoms, and all other reviewed systems are negative. EDS controlled under adderall, takes it prn for driving, for conferences, tests etc.  Lost arm swing in the left arm- attributed to shoulder injury, still has PT>  Thumb with tremor.   Anxiety/  depression score n/a    Social History   Social History  . Marital status: Married    Spouse name: N/A  . Number of children: 3  . Years of education: college   Occupational History  . instructor     [employer]   Social History Main Topics  . Smoking status: Never Smoker  . Smokeless tobacco: Never Used  .  Alcohol use 1.0 - 1.5 oz/week    2 - 3 Standard drinks or equivalent per week     Comment: 4 glasses of wine weekly  . Drug use: No  . Sexual activity: Yes    Partners: Male    Birth control/ protection: Post-menopausal   Other Topics Concern  . Not on file   Social History Narrative   See previous notes.    Family History  Problem Relation Age of Onset  . Breast cancer Mother 34  . Osteoporosis Mother   . Prostate cancer Father   . Thyroid disease Father   . Mental retardation Sister     Past Medical History:  Diagnosis Date  . Basal cell carcinoma   . Drowsiness    excessive daytime  . Hx of migraines   . Hypersomnia 08/26/2013  . Hypothyroidism   . Hypothyroidism 10/21/2014  . Narcolepsy     Past Surgical History:  Procedure Laterality Date  . MENISCUS REPAIR Right 05/2014    Current Outpatient Prescriptions  Medication Sig Dispense Refill  . amphetamine-dextroamphetamine (ADDERALL) 10 MG tablet Take 1 tablet (10 mg total) by mouth daily. 30 tablet 0  . cholecalciferol (VITAMIN D) 1000 UNITS tablet Take 1,000 Units by mouth daily.    . clobetasol ointment (TEMOVATE) 0.05 % Apply topically twice daily for no more than 7 days. 45 g 1  . Lactase (LACTAID PO) Take by mouth as needed.    Marland Kitchen levothyroxine (SYNTHROID, LEVOTHROID) 88 MCG tablet 88 mcg daily.    . meloxicam (MOBIC) 15 MG tablet Take 15 mg by mouth daily as needed.     . NONFORMULARY OR COMPOUNDED ITEM Estradiol 0.02% in 38ml prefilled syringes.  One syringe pv two to three times weekly. 36 each 0  . NONFORMULARY OR COMPOUNDED ITEM Topical testosterone cream 1mg / 0.67ml.  Apply 0.24ml topically two times weekly. #48month supply/1RF 1 each 1  . SUMAtriptan (IMITREX) 100 MG tablet 100 mg as needed.     No current facility-administered medications for this visit.     Allergies as of 07/11/2017 - Review Complete 07/11/2017  Allergen Reaction Noted  . Ciprofloxacin Diarrhea 06/11/2017  . Codeine Nausea Only  06/11/2017    Vitals: BP 124/87   Pulse 79   Ht 5\' 5"  (1.651 m)   Wt 127 lb (57.6 kg)   LMP 07/03/2008   BMI 21.13 kg/m  Last Weight:  Wt Readings from Last 1 Encounters:  07/11/17 127 lb (57.6 kg)   KGU:RKYH mass index is 21.13 kg/m.     Last Height:   Ht Readings from Last 1 Encounters:  07/11/17 5\' 5"  (1.651 m)    Physical exam:  General: The patient is awake, alert and appears not in acute distress. The patient is well groomed. Head: Normocephalic, atraumatic. Neck is supple.Retrognathia is seen.  Cardiovascular:  Regular rate and rhythm, without  murmurs or carotid bruit, and without distended neck veins. Respiratory: Lungs are clear to auscultation.  Skin:  Without evidence of edema, -hands and feet are bluish discoloured, she is cold intolerant. Has low BP. MSA?   Trunk: BMI is 21. The patient's posture is erect , not stooped.   Neurologic exam : The patient is awake and alert, oriented to place and time.   Memory subjective described as intact.     Attention span & concentration ability appears normal.  Speech with dysphonia , not  aphasia.  Mood and affect are anxious . Cranial nerves: Pupils are equal and briskly reactive to light. Funduscopic exam without  evidence of pallor or edema. Extraocular movements  in vertical and horizontal planes intact and without nystagmus. Visual fields by finger perimetry are intact. The facial expression is frozen- almost astonished, in disbelief. Wide open eyes.  Hearing to finger rub intact.  Facial sensation intact to fine touch. Facial motor strength is symmetric and tongue and uvula move midline. Shoulder shrug was symmetrical.   Motor exam: increased biceps tone, with cog wheeling.  muscle bulk is symmetric and less strength in left hand. She noticed when typing.  She is Dominantly right handed. Her handwriting has not changed.  Coordination: Rapid alternating movements in the fingers/hands was normal. Finger-to-nose  maneuver  normal without evidence of ataxia, dysmetria or tremor. Gait and station: Patient walks without assistive device and is able unassisted to climb up to the exam table. Strength within normal limits.  Stance is stable and normal.  Tandem gait is unfragmented. Turns with  3 Steps. Lack of left hand armswing, keeps thumb and index finger tight together -  Sensory:  Proprioception tested in the upper extremities was normal. hands and feet are bluish discoloured, she is cold intolerant. Has low BP.   Deep tendon reflexes: in the  upper and lower extremities are symmetric and intact. Babinski maneuver response is  downgoing.    Assessment:  After physical and neurologic examination, review of laboratory studies,  Personal review of imaging studies, reports of other /same  Imaging studies, results of polysomnography and / or neurophysiology testing and pre-existing records as far as provided in visit., my assessment ist:  1)  Abnormal DAT scan confirms non tremor dominant form of PD- Patient is very reluctant to accept this. Sarah Montoya is a noticeable absent left-sided arm swing when walking, she has noticed a tremor in her left thumb. She can provide normal arm swing if she walks and holds on to a squeeze ball. She does not have sign of shuffling and she turns with 3 steps, walks without assistive device. Dr Rodena Medin confirmed constricted movements in left arm due to an exercise injury. She has neither fallen nor reporting any RLS symptoms or REM BD.   I cannot attribute all the findings including cogwheeling, biceps rigidity on post upper extremities and the loss of arm swing on the left only to her shoulder injury. I am concerned that her facial mimic has changed, I am also concerned that she reports cold hand and feet bluish discoloration and low blood pressures.   She started on sinemet 25/100 mg. Only agreed to BID regimen, Will go to [facility] for exercises with prince.    Addendum :Sarah Montoya  , who was willing to see a movement disorder specialist for second opinion returned and asked for [facility]- . She would like to see Dr. Deboraha Sprang at [place], MD 7/82/9562, 13:08 AM  Certified in Neurology by ABPN Certified in Sleep Medicine by [facility]  [place], [place] [place], [place] [address]

## 2017-07-11 NOTE — Addendum Note (Signed)
Addended by: Larey Seat on: 07/11/2017 12:01 PM   Modules accepted: Orders

## 2017-07-11 NOTE — Patient Instructions (Signed)
Carbidopa; Levodopa tablets What is this medicine? CARBIDOPA;LEVODOPA (kar bi DOE pa; lee voe DOE pa) is used to treat the symptoms of Parkinson's disease. This medicine may be used for other purposes; ask your health care provider or pharmacist if you have questions. COMMON BRAND NAME(S): Atamet, SINEMET What should I tell my health care provider before I take this medicine? They need to know if you have any of these conditions: -asthma or lung disease -depression or other mental illness -diabetes -glaucoma -heart disease, including history of a heart attack -irregular heart beat -kidney or liver disease -melanoma or suspicious skin lesions -stomach or intestine ulcers -an unusual or allergic reaction to levodopa, carbidopa, other medicines, foods, dyes, or preservatives -pregnant or trying to get pregnant -breast-feeding How should I use this medicine? Take this medicine by mouth with a glass of water. Follow the directions on the prescription label. Take your doses at regular intervals. Do not take your medicine more often than directed. Do not stop taking except on the advice of your doctor or health care professional. Talk to your pediatrician regarding the use of this medicine in children. Special care may be needed. Overdosage: If you think you have taken too much of this medicine contact a poison control center or emergency room at once. NOTE: This medicine is only for you. Do not share this medicine with others. What if I miss a dose? If you miss a dose, take it as soon as you can. If it is almost time for your next dose, take only that dose. Do not take double or extra doses. What may interact with this medicine? Do not take this medicine with any of the following medications: -MAOIs like Marplan, Nardil, and Parnate -reserpine -tetrabenazine This medicine may also interact with the following medications: -alcohol -droperidol -entacapone -iron supplements or multivitamins  with iron -isoniazid, INH -linezolid -medicines for depression, anxiety, or psychotic disturbances -medicines for high blood pressure -medicines for sleep -metoclopramide -papaverine -procarbazine -tedizolid -rasagiline -selegiline -tolcapone This list may not describe all possible interactions. Give your health care provider a list of all the medicines, herbs, non-prescription drugs, or dietary supplements you use. Also tell them if you smoke, drink alcohol, or use illegal drugs. Some items may interact with your medicine. What should I watch for while using this medicine? Visit your doctor or health care professional for regular checks on your progress. It may be several weeks or months before you feel the full benefits of this medicine. Continue to take your medicine on a regular schedule. Do not take any additional medicines for Parkinson's disease without first consulting with your health care provider. You may experience a wearing of effect prior to the time for your next dose of this medicine. You may also experience an on-off effect where the medicine apparently stops working for anything from a minute to several hours, then suddenly starts working again. Tell your doctor or health care professional if any of these symptoms happen to you. Your dose may need to be changed. A high protein diet can slow or prevent absorption of this medicine. Avoid high protein foods near the time of taking this medicine to help to prevent these problems. Take this medicine at least 30 minutes before eating or one hour after meals. You may want to eat higher protein foods later in the day or in small amounts. Discuss your diet with your doctor or health care professional or nutritionist. You may get drowsy or dizzy. Do not drive, use machinery,  or do anything that needs mental alertness until you know how this drug affects you. Do not stand or sit up quickly, especially if you are an older patient. This  reduces the risk of dizzy or fainting spells. Alcohol can make you more drowsy and dizzy. Avoid alcoholic drinks. If you find that you have sudden feelings of wanting to sleep during normal activities, like cooking, watching television, or while driving or riding in a car, you should contact your health care professional. If you are diabetic, this medicine may interfere with the accuracy of some tests for sugar or ketones in the urine (does not interfere with blood tests). Check with your doctor or health care professional before changing the dose of your diabetic medicine. This medicine may discolor the urine or sweat, making it look darker or red in color. This is of no cause for concern. However, this may stain clothing or fabrics. There have been reports of increased sexual urges or other strong urges such as gambling while taking some medicines for Parkinson's disease. If you experience any of these urges while taking this medicine, you should report it to your health care provider as soon as possible. You should check your skin often for changes to moles and new growths while taking this medicine. Call your doctor if you notice any of these changes. What side effects may I notice from receiving this medicine? Side effects that you should report to your doctor or health care professional as soon as possible: -allergic reactions like skin rash, itching or hives, swelling of the face, lips, or tongue -anxiety, confusion, or nervousness -falling asleep during normal activities like driving -fast, irregular heartbeat -hallucination, loss of contact with reality -mood changes like aggressive behavior, depression -stomach pain -trouble passing urine -uncontrolled movements of the mouth, head, hands, feet, shoulders, eyelids or other unusual muscle movements Side effects that usually do not require medical attention (report to your doctor or health care professional if they continue or are  bothersome): -headache -loss of appetite -muscle twitches -nausea, vomiting -nightmares, trouble sleeping -unusually weak or tired This list may not describe all possible side effects. Call your doctor for medical advice about side effects. You may report side effects to FDA at 1-800-FDA-1088. Where should I keep my medicine? Keep out of the reach of children. Store at room temperature between 15 and 30 degrees C (59 and 86 degrees F). Protect from light. Throw away any unused medicine after the expiration date. NOTE: This sheet is a summary. It may not cover all possible information. If you have questions about this medicine, talk to your doctor, pharmacist, or health care provider.  2018 Elsevier/Gold Standard (2014-01-05 15:41:53)

## 2017-07-11 NOTE — Addendum Note (Signed)
Addended by: Larey Seat on: 07/11/2017 11:58 AM   Modules accepted: Orders

## 2017-07-16 ENCOUNTER — Telehealth: Payer: Self-pay | Admitting: Neurology

## 2017-07-16 NOTE — Telephone Encounter (Signed)
Called and spoke to Nurse Kennyth Lose Reviewer at [facility] that Dr. Deboraha Sprang does not have a open slot until April 2019. Melodie Bouillon that  She maybe could get patient in sooner with one of Dr. Liane Comber colleagues . Dr. Brett Fairy would you be ok with that ?  Dr. Hall Busing or Dr. Tonye Royalty.

## 2017-07-17 ENCOUNTER — Encounter: Payer: Self-pay | Admitting: Neurology

## 2017-07-17 NOTE — Telephone Encounter (Signed)
Called Patient and relayed that Dr. Brett Fairy was fine with seeing another doctor. Patient relayed she did not want to and that she would  Wait For Dr. Linus Mako and to Just put her on CX list she was fine to wait . I called and relayed message to Magdalen Spatz at [facility].

## 2017-07-17 NOTE — Telephone Encounter (Signed)
Yes, that would be fine. CD

## 2017-07-18 ENCOUNTER — Telehealth: Payer: Self-pay | Admitting: Neurology

## 2017-07-18 NOTE — Telephone Encounter (Signed)
Noted I have sent  Referral has been sent to Dr. Darlyne Russian  colleges . Thanks Hinton Dyer

## 2017-07-18 NOTE — Telephone Encounter (Signed)
Mrs. Lampe ,   Please go to three times a day dosing if you did tolerate the bid regimen on Carbi-Levo Dopa . It is the regular dose for this medications.  I will try to ave Dr. Tonye Royalty see you, his waiting list is shorter.   Larey Seat, MD   Myriam Jacobson, please ask for Dr Jodi Geralds partner.

## 2017-07-24 ENCOUNTER — Ambulatory Visit: Payer: Medicare Other | Admitting: Neurology

## 2017-07-24 DIAGNOSIS — C44519 Basal cell carcinoma of skin of other part of trunk: Secondary | ICD-10-CM | POA: Diagnosis not present

## 2017-07-24 DIAGNOSIS — Z85828 Personal history of other malignant neoplasm of skin: Secondary | ICD-10-CM | POA: Diagnosis not present

## 2017-07-24 DIAGNOSIS — D485 Neoplasm of uncertain behavior of skin: Secondary | ICD-10-CM | POA: Diagnosis not present

## 2017-07-25 ENCOUNTER — Encounter: Payer: Self-pay | Admitting: Neurology

## 2017-07-29 ENCOUNTER — Other Ambulatory Visit: Payer: Self-pay | Admitting: Neurology

## 2017-07-29 DIAGNOSIS — G20 Parkinson's disease: Secondary | ICD-10-CM

## 2017-07-29 MED ORDER — CARBIDOPA-LEVODOPA 25-100 MG PO TABS: 1.0000 | ORAL_TABLET | Freq: Three times a day (TID) | ORAL | 0 refills | Status: DC

## 2017-08-08 ENCOUNTER — Encounter (HOSPITAL_COMMUNITY): Payer: Medicare Other

## 2017-08-08 ENCOUNTER — Other Ambulatory Visit (HOSPITAL_COMMUNITY): Payer: Medicare Other

## 2017-08-12 ENCOUNTER — Encounter: Payer: Self-pay | Admitting: Obstetrics & Gynecology

## 2017-08-12 ENCOUNTER — Other Ambulatory Visit: Payer: Self-pay | Admitting: Obstetrics & Gynecology

## 2017-08-12 MED ORDER — NONFORMULARY OR COMPOUNDED ITEM: 4 refills | Status: DC

## 2017-08-13 NOTE — Telephone Encounter (Signed)
Prescription for estradiol 92ml prefilled syringes, #36, 4RF faxed to [facility]. Fax #: [phone].

## 2017-08-16 ENCOUNTER — Other Ambulatory Visit: Payer: Self-pay

## 2017-08-20 DIAGNOSIS — G20 Parkinson's disease: Secondary | ICD-10-CM | POA: Diagnosis not present

## 2017-08-20 DIAGNOSIS — Z79899 Other long term (current) drug therapy: Secondary | ICD-10-CM | POA: Diagnosis not present

## 2017-08-20 DIAGNOSIS — E039 Hypothyroidism, unspecified: Secondary | ICD-10-CM | POA: Diagnosis not present

## 2017-08-20 DIAGNOSIS — Z885 Allergy status to narcotic agent status: Secondary | ICD-10-CM | POA: Diagnosis not present

## 2017-09-01 ENCOUNTER — Other Ambulatory Visit: Payer: Self-pay | Admitting: Neurology

## 2017-09-01 DIAGNOSIS — G20 Parkinson's disease: Secondary | ICD-10-CM

## 2017-09-13 DIAGNOSIS — Z23 Encounter for immunization: Secondary | ICD-10-CM | POA: Diagnosis not present

## 2017-09-27 ENCOUNTER — Other Ambulatory Visit (INDEPENDENT_AMBULATORY_CARE_PROVIDER_SITE_OTHER): Payer: Medicare Other

## 2017-09-27 ENCOUNTER — Other Ambulatory Visit: Payer: Medicare Other

## 2017-09-27 ENCOUNTER — Other Ambulatory Visit: Payer: Self-pay

## 2017-09-27 DIAGNOSIS — R6882 Decreased libido: Secondary | ICD-10-CM

## 2017-09-28 ENCOUNTER — Other Ambulatory Visit: Payer: Self-pay | Admitting: Neurology

## 2017-09-28 DIAGNOSIS — G20 Parkinson's disease: Secondary | ICD-10-CM

## 2017-09-28 LAB — TESTOSTERONE

## 2017-10-02 ENCOUNTER — Encounter: Payer: Self-pay | Admitting: Neurology

## 2017-10-02 ENCOUNTER — Ambulatory Visit (INDEPENDENT_AMBULATORY_CARE_PROVIDER_SITE_OTHER): Payer: Medicare Other | Admitting: Neurology

## 2017-10-02 VITALS — BP 112/76 | HR 92 | Ht 65.0 in | Wt 125.0 lb

## 2017-10-02 DIAGNOSIS — G4710 Hypersomnia, unspecified: Secondary | ICD-10-CM

## 2017-10-02 DIAGNOSIS — G20 Parkinson's disease: Secondary | ICD-10-CM | POA: Diagnosis not present

## 2017-10-02 MED ORDER — AMPHETAMINE-DEXTROAMPHETAMINE 10 MG PO TABS: 10.0000 mg | ORAL_TABLET | Freq: Every day | ORAL | 0 refills | Status: DC

## 2017-10-02 MED ORDER — CARBIDOPA-LEVODOPA 25-100 MG PO TABS: 1.0000 | ORAL_TABLET | Freq: Three times a day (TID) | ORAL | 2 refills | Status: DC

## 2017-10-02 MED ORDER — RASAGILINE MESYLATE 1 MG PO TABS: 1.0000 mg | ORAL_TABLET | Freq: Every day | ORAL | 0 refills | Status: DC

## 2017-10-02 NOTE — Patient Instructions (Signed)
Rasagiline Oral Tablets What is this medicine? RASAGILINE (ra SA ji leen) is a monoamine oxidase inhibitor (MAOI). It is used to treat Parkinson's disease. This medicine may be used for other purposes; ask your health care provider or pharmacist if you have questions. COMMON BRAND NAME(S): Azilect What should I tell my health care provider before I take this medicine? They need to know if you have any of these conditions: -high or low blood pressure -history of stroke -if you drink alcohol -liver disease -narcolepsy sleep disorder -schizophrenia -skin cancer -taken an MAOI like Carbex, Eldepryl, Xadago, Marplan, Nardil, or Parnate in last 14 days -an unusual or allergic reaction to rasagiline, other medicines, foods, dyes, or preservatives -pregnant or trying to get pregnant -breast-feeding How should I use this medicine? Take this medicine by mouth with a glass of water. Follow the directions on the prescription label. Take your doses at regular intervals. Do not take your medicine more often than directed. Do not stop taking except on your doctor's advice. Talk to your pediatrician regarding the use of this medicine in children. Special care may be needed. Overdosage: If you think you have taken too much of this medicine contact a poison control center or emergency room at once. NOTE: This medicine is only for you. Do not share this medicine with others. What if I miss a dose? If you miss a dose, take it as soon as you can. If it is almost time for your next dose, take only that dose. Do not take double or extra doses. What may interact with this medicine? Do not take this medicine with any of the following medications: -certain medicines for depression -cyclobenzaprine -dextromethorphan -linezolid -MAOIs like Carbex, Eldepryl, Xadago, Marplan, Nardil, and Parnate -meperidine -methadone -methylene blue -propoxyphene -stimulant medicines for attention disorders, weight loss, or  to stay awake -St. John's Wort -tramadol -tryptophan This medicine may also interact with the following medications: -alcohol -amiodarone -antibiotics like ciprofloxacin, norfloxacin -antihistamines for allergy, cough and cold -carbamazepine -cimetidine -decongestants, including nasal sprays or eye drops -female hormones, like estrogens -furazolidone -isoniazid -medicines for anxiety or psychotic disturbances -medicines for sleep during surgery -mexiletine -narcotic medicines for pain -procarbazine -theophylline -tizanidine -yohimbine -zileuton This list may not describe all possible interactions. Give your health care provider a list of all the medicines, herbs, non-prescription drugs, or dietary supplements you use. Also tell them if you smoke, drink alcohol, or use illegal drugs. Some items may interact with your medicine. What should I watch for while using this medicine? Tell your doctor or healthcare professional if your symptoms do not start to get better or if they get worse. Visit your doctor or health care professional for regular checks on your progress. Do not stop this medicine suddenly. Ask your doctor or health care professional for advice about gradually reducing your dose. You may get drowsy, dizzy, or have blurred vision. Do not drive, use machinery, work in high places, or do anything that needs mental alertness until you know how this medicine affects you. Do not stand or sit up quickly, especially if you are an older patient. This reduces the risk of dizzy or fainting spells. Alcohol may increase dizziness or drowsiness. Talk to your doctor before drinking alcoholic beverages while taking this medicine. It is possible to suddenly fall asleep or suddenly feel like falling asleep during usual activities of daily living (e.g., cooking, driving a car, talking on the phone, eating, working) while you are taking this medicine. This may result in having  accidents, which can  be severe. Your chances of falling asleep while doing normal activities and taking this medicine are greater if you take other medicines that cause drowsiness. If you find that you suddenly fall asleep or suddenly feel like falling asleep during usual daily activities, you should not drive or participate in potentially dangerous activities and you should contact your doctor right away. You should not drive, operate machinery, or work at heights during treatment with this medicine if you have ever experienced severe drowsiness and/or have fallen asleep without warning before using this medicine. While taking this medicine, you may feel increased sexual urges, or other strong urges to gamble, spend money, or binge eat, and be unable to control these urges. If you or your family notice these or other strong urges while you are taking this medicine, you should report this to your health care provider as soon as possible. Foods that contain very high amounts of tyramine, such as aged, fermented, cured, smoked and pickled foods, should be avoided while taking this medicine. The combination may cause a dangerous rise in blood pressure. Ask your doctor or health care professional, pharmacist, or nutritionist for a complete listing of foods and beverages that are high in tyramine. If you consume a food or beverage very rich in tyramine and do not feel well soon after eating, contact your health care provider. Some medicines may interact with this medicine and could cause adverse effects. Talk to your doctor if you are taking or planning to take any over-the-counter drugs, especially cough remedies or decongestants, including nasal sprays or eye drops. This medicine may also interact with antidepressants and certain medicines for pain. Contact your health care provider before taking new medications including antidepressants, pain medicines, or prescription or over-the-counter medicines for congestion, cough, colds, or  allergies. If you are scheduled for any medical or dental procedure, tell your healthcare provider that you are taking this medicine. This medicine can interact with other medicines used during surgery. This medicine may increase your risk of getting skin cancer. Report any irregular moles, dark or white spots, or sores that do not heal. Have your skin checked for skin cancer regularly. What side effects may I notice from receiving this medicine? Side effects that you should report to your doctor or health care professional as soon as possible: -allergic reactions like skin rash, itching or hives, swelling of the face, lips, or tongue -changes in vision -falls -hallucination, loss of contact with reality -high blood pressure -signs and symptoms of hypertensive crisis like severe headache with confusion and blurred vision, severe chest pain, shortness of breath, nausea and vomiting, seizures -signs and symptoms of low blood pressure like dizziness; feeling faint or lightheaded, falls; unusually weak or tired -signs and symptoms of serotonin syndrome like confusion, heavy sweating, fever, involuntary muscle jerking, tremor, rigid muscles, unstable blood pressure, diarrhea -suddenly falling asleep or sudden feelings of wanting to fall asleep during usual activities like driving or eating -suicidal thoughts or other mood changes -uncontrolled, sudden body movements (dyskinesia) -unusual or obvious changes in behavior such as agitation, confusion, disorganized thinking Side effects that usually do not require medical attention (report to your doctor or health care professional if they continue or are bothersome): -dry mouth -nausea -trouble sleeping -upset stomach This list may not describe all possible side effects. Call your doctor for medical advice about side effects. You may report side effects to FDA at 1-800-FDA-1088. Where should I keep my medicine? Keep out of the reach  of  children. Store at room temperature between 15 and 30 degrees C (59 and 86 degrees F). Throw away any unused medicine after the expiration date. NOTE: This sheet is a summary. It may not cover all possible information. If you have questions about this medicine, talk to your doctor, pharmacist, or health care provider.  2018 Elsevier/Gold Standard (2016-02-20 10:39:14)

## 2017-10-02 NOTE — Progress Notes (Signed)
[facility]   Provider:  Larey Seat, M D  Primary Care Physician:  Josetta Huddle, MD   Referring Provider: Josetta Huddle, MD   Chief Complaint  Patient presents with  . Follow-up    Patient states that she has been doing well.     HPI:  Sarah Montoya is a 66 y.o. female , seen here as in a referral  from Dr. Inda Merlin for  BRADYKINESIA.  I am seeing Sarah Montoya today on 02 October 2017, she had a fruitful visit on 2 October with Dr. Eldridge Abrahams at [facility].  He agrees with the diagnosis of PD and treatment on Sinemet and would like her to continue on 25/ 100 mg carbidopa- levodopa 1 pill 3 times daily before meals.   He found the motor UPDRS score  to be at 21 points manifesting in the left arm resting tremor rigidity and bradykinesia, walking with a decreased left armswing.   Minimal symptoms on the right side good response to Sinemet as the patient felt that she had more energy felt younger and felt better functioning on medication than before. We will talk about selegiline and dopaminergic agonists today.        Interval history from 07/11/2017. I have the opportunity today to meet with Sarah Montoya and discuss her recent CAT scan which was performed at [facility] on 07/04/2017. 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 The results of her study showed that the patient had decreased accumulation of the radiotracer worse on the right in comparison to the left brain."have some suggested loss of dopamine transportation, this is a feature seen in Parkinson's type syndrome, and would manifest usually as left-sided rigor or tremor".  I have discussed with Sarah Montoya that I would like to place her on classic Sinemet 3 times a day 100/25 mg to see if she feels that her movements are responding  that she has more fluidity. The patient does not have primary tremor. I also entertained the possibility that she has multisystem atrophy. She reports not to have fallen or fainted. She has no RLS. No vivid dreams,   She is hesitant to try medication. She would rather try an exercise regimen to see if her bradykinesia responds and she is open to visit with movement specialists Dr. Rexene Alberts or Dr. Carles Collet  for a second opinion. She agreed to a 30 day trial of medication, 100-25 mg and agreed to twice a day instead of TID.    I have followed [name] since December 2013 for hypersomnia and had worked her up for possible sleep apnea. In December 2016 and 2017 she followed up with my nurse practitioner Cecille Rubin. She reports that her primary care physician, Dr. Josetta Huddle, so-called walking outside the office and had noted that she walked slowly. In addition she has a very hoarse voice and she does have a reduced facial expression, rare blink. She has an astonished facial expression.  She carries the following diagnoses, hypothyroidism, migraine headaches, basal cell cancer of the nose surgically removed in August 2014 by Dr. Rolm Bookbinder. Left shoulder pain followed by Dr. Flossie Dibble, and she is followed for sports medicine by Dr. Oneida Alar. Current medications Adderall for hypersomnia, L-thyroxine, sumatriptan as needed,  Social history:  the patient social history she has never used tobacco products she uses rarely on occasion alcohol, has no history  of regression or drug use, she is married has 3 healthy children, she works as a Financial risk analyst, she has been drinking coffee but only in the morning, she quit afternoon coffee due to headaches are provoked.  Chief complaint according to patient : " My doctor found me walking funny"  Review of Systems: Out of a complete 14 system review, the patient complains of only the following symptoms, and all other reviewed systems are negative. EDS controlled  under adderall, takes it prn for driving, for conferences, tests etc.   Lost arm swing in the left arm- attributed to shoulder injury, Thumb  Tremor ( pill rolling ) .  Low BP, and loss of right  Armswing, bradykinesia.     Social History   Socioeconomic History  . Marital status: Married    Spouse name: Not on file  . Number of children: 3  . Years of education: college  . Highest education level: Not on file  Social Needs  . Financial resource strain: Not on file  . Food insecurity - worry: Not on file  . Food insecurity - inability: Not on file  . Transportation needs - medical: Not on file  . Transportation needs - non-medical: Not on file  Occupational History  . Occupation: Art therapist    Comment: [employer]  Tobacco Use  . Smoking status: Never Smoker  . Smokeless tobacco: Never Used  Substance and Sexual Activity  . Alcohol use: Yes    Alcohol/week: 1.0 - 1.5 oz    Types: 2 - 3 Standard drinks or equivalent per week    Comment: 4 glasses of wine weekly  . Drug use: No  . Sexual activity: Yes    Partners: Male    Birth control/protection: Post-menopausal  Other Topics Concern  . Not on file  Social History Narrative   See previous notes.    Family History  Problem Relation Age of Onset  . Breast cancer Mother 16  . Osteoporosis Mother   . Prostate cancer Father   . Thyroid disease Father   . Mental retardation Sister     Past Medical History:  Diagnosis Date  . Basal cell carcinoma   . Drowsiness    excessive daytime  . Hx of migraines   . Hypersomnia 08/26/2013  . Hypothyroidism   . Hypothyroidism 10/21/2014  . Narcolepsy     Past Surgical History:  Procedure Laterality Date  . MENISCUS REPAIR Right 05/2014      Allergies as of 10/02/2017 - Review Complete 10/02/2017  Allergen Reaction Noted  . Ciprofloxacin Diarrhea 06/11/2017  . Codeine Nausea Only 06/11/2017    Vitals: BP 112/76   Pulse 92   Ht 5\' 5"  (1.651 m)   Wt 125 lb  (56.7 kg)   LMP 07/03/2008   BMI 20.80 kg/m  Last Weight:  Wt Readings from Last 1 Encounters:  10/02/17 125 lb (56.7 kg)   IHK:VQQV mass index is 20.8 kg/m.     Last Height:   Ht Readings from Last 1 Encounters:  10/02/17 5\' 5"  (1.651 m)    Physical exam:  General: The patient is awake, alert and appears not in acute distress. The patient is well groomed. Head: Normocephalic, atraumatic. Neck is supple.Retrognathia is seen.  Cardiovascular:  Regular rate and rhythm, without  murmurs or carotid bruit, and without distended neck veins. Respiratory: Lungs are clear to auscultation. Skin:  Without evidence of edema, but hands and feet are bluish discoloured, she is cold intolerant. Has low  BP.    Trunk: BMI is 21. The patient's posture is erect , not stooped.   Neurologic exam : The patient is awake and alert, oriented to place and time.   Memory subjective described as intact.   Attention span & concentration ability appears normal.  Speech with dysphonia , not  aphasia.  Mood and affect are anxious . Cranial nerves: Pupils are equal and briskly reactive to light. Funduscopic exam without  evidence of pallor or edema. Extraocular movements  in vertical and horizontal planes intact and without nystagmus. Visual fields by finger perimetry are intact. The facial expression is frozen- almost astonished, in disbelief. Wide open eyes.  Hearing to finger rub intact.  Facial sensation intact to fine touch. Facial motor strength is symmetric and tongue and uvula move midline. Shoulder shrug was symmetrical.   Motor exam: increased biceps tone, with cog wheeling.  muscle bulk is symmetric and less strength in left hand. She noticed when typing.  She is Dominantly right handed. Her handwriting has not changed.  Coordination: Rapid alternating movements in the fingers/hands was normal. Finger-to-nose maneuver  normal without evidence of ataxia, dysmetria or tremor. Gait and station: Patient  walks without assistive device and is able unassisted to climb up to the exam table. Strength within normal limits. Stance is stable and normal.  Tandem gait is unfragmented. Turns with  3 Steps. Lack of left hand armswing, keeps thumb and index finger tight together - Sensory:  Proprioception tested in the upper extremities was normal. Deep tendon reflexes: in the  upper and lower extremities are symmetric and intact. Babinski maneuver response is  downgoing.    Assessment:  After physical and neurologic examination, review of laboratory studies,  Personal review of imaging studies, reports of other /same  Imaging studies, results of polysomnography and / or neurophysiology testing and pre-existing records as far as provided in visit., my assessment ist:  1)  Abnormal DAT scan confirms non tremor dominant form of PD- Patient was very reluctant to accept this.  Dr Hillery Hunter confirmed the diagnosis.  Sarah Montoya is a noticeable absent left-sided arm swing when walking, she has noticed a tremor in her left thumb. She can provide normal arm swing if she walks and holds on to a squeeze ball. She does not have sign of shuffling and she turns with 3 steps, walks without assistive de vice. Dr Nicholes Stairs confirmed constricted movements in left arm due to an exercise injury.  She still has neither fallen nor reporting any RLS symptoms or REM BD.   I cannot attribute all the findings including cogwheeling, biceps rigidity on post upper extremities and the loss of arm swing on the left only to her shoulder injury. I am concerned that her facial mimic has changed, I am also concerned that she reports cold hand and feet bluish discoloration and low blood pressures.   She started on Sinemet 25/100 mg. TID. Before meals.  Exercises with Alfonse Spruce.  I started Azilect today, once a day.  Adderall prn - for energy.    Larey Seat, MD 36/64/4034, 74:25 AM  Certified in Neurology by ABPN Certified in [place] by  [facility] [address], [place], [place] [address]

## 2017-10-30 ENCOUNTER — Other Ambulatory Visit: Payer: Self-pay | Admitting: Neurology

## 2017-11-01 ENCOUNTER — Telehealth: Payer: Self-pay | Admitting: Neurology

## 2017-11-01 DIAGNOSIS — L910 Hypertrophic scar: Secondary | ICD-10-CM | POA: Diagnosis not present

## 2017-11-01 DIAGNOSIS — L308 Other specified dermatitis: Secondary | ICD-10-CM | POA: Diagnosis not present

## 2017-11-01 DIAGNOSIS — L821 Other seborrheic keratosis: Secondary | ICD-10-CM | POA: Diagnosis not present

## 2017-11-01 DIAGNOSIS — D485 Neoplasm of uncertain behavior of skin: Secondary | ICD-10-CM | POA: Diagnosis not present

## 2017-11-01 DIAGNOSIS — L814 Other melanin hyperpigmentation: Secondary | ICD-10-CM | POA: Diagnosis not present

## 2017-11-01 DIAGNOSIS — D692 Other nonthrombocytopenic purpura: Secondary | ICD-10-CM | POA: Diagnosis not present

## 2017-11-01 DIAGNOSIS — D2272 Melanocytic nevi of left lower limb, including hip: Secondary | ICD-10-CM | POA: Diagnosis not present

## 2017-11-01 DIAGNOSIS — D1801 Hemangioma of skin and subcutaneous tissue: Secondary | ICD-10-CM | POA: Diagnosis not present

## 2017-11-01 DIAGNOSIS — Z85828 Personal history of other malignant neoplasm of skin: Secondary | ICD-10-CM | POA: Diagnosis not present

## 2017-11-01 DIAGNOSIS — L570 Actinic keratosis: Secondary | ICD-10-CM | POA: Diagnosis not present

## 2017-11-01 NOTE — Telephone Encounter (Signed)
Received a notification that stated there was a over ride needed to the medications stating that the MD was aware there was interactions with the medication. I called the number on file and basically did a PA on the phone. This can take 72 hours before getting a response. REF # [id number].

## 2017-11-04 NOTE — Telephone Encounter (Signed)
[employer] approved coverage for generic adderall. PA approved thru 11/02/2018.

## 2018-04-01 ENCOUNTER — Ambulatory Visit (INDEPENDENT_AMBULATORY_CARE_PROVIDER_SITE_OTHER): Payer: Medicare Other | Admitting: Neurology

## 2018-04-01 ENCOUNTER — Encounter: Payer: Self-pay | Admitting: Neurology

## 2018-04-01 VITALS — BP 106/72 | HR 89 | Ht 65.0 in | Wt 125.0 lb

## 2018-04-01 DIAGNOSIS — G20 Parkinson's disease: Secondary | ICD-10-CM | POA: Diagnosis not present

## 2018-04-01 DIAGNOSIS — Z5181 Encounter for therapeutic drug level monitoring: Secondary | ICD-10-CM | POA: Diagnosis not present

## 2018-04-01 MED ORDER — RASAGILINE MESYLATE 1 MG PO TABS: 1.0000 mg | ORAL_TABLET | Freq: Every day | ORAL | 3 refills | Status: DC

## 2018-04-01 MED ORDER — AMPHETAMINE-DEXTROAMPHETAMINE 10 MG PO TABS: 10.0000 mg | ORAL_TABLET | Freq: Every day | ORAL | 0 refills | Status: DC

## 2018-04-01 NOTE — Progress Notes (Signed)
SLEEP [facility]INE [facility]INIC   Provider:  Larey Seat, M D  Primary Care Physician:  Josetta Huddle, MD   Referring Provider: Josetta Huddle, MD   Chief Complaint  Patient presents with  . Follow-up    pt alone, rm . pt states there has been no issues since her last visit.    HPI:  Sarah Montoya is a 67 y.o. female , seen here originally in a referral from Dr. Inda Merlin for BRADYKINESIA.  Rv from 04-01-2018,  Has folowed dr Chauncey Cruel at [facility] every November . Doing well, less fatigued and sleepy on Adderall  Reports no involuntary movements and no Tremor on Sinemet . No back or hip pain. Significant Reynaud's syndrome and bluish palms and fingers. She exercises a lot, medication improved the "bounce and balance/ coordination" . She noted her typing skills improved.    RV, CD I am seeing Sarah Montoya today on 02 October 2017, she had a fruitful visit on 2 October with Dr. Eldridge Abrahams at [facility].  He agrees with the diagnosis of PD and treatment on Sinemet and would like her to continue on 25/ 100 mg carbidopa- levodopa 1 pill 3 times daily before meals.   He found the motor UPDRS score  to be at 21 points manifesting in the left arm resting tremor rigidity and bradykinesia, walking with a decreased left armswing.   Minimal symptoms on the right side good response to Sinemet as the patient felt that she had more energy felt younger and felt better functioning on medication than before. We will talk about selegiline and dopaminergic agonists today.    Interval history from 07/11/2017. I have the opportunity today to meet with Sarah Montoya and discuss her recent CAT scan which was performed at [facility] on 07/04/2017. 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 The results of her study showed that the patient had  decreased accumulation of the radiotracer worse on the right in comparison to the left brain."have some suggested loss of dopamine transportation, this is a feature seen in Parkinson's type syndrome, and would manifest usually as left-sided rigor or tremor".  I have discussed with Sarah Montoya that I would like to place her on classic Sinemet 3 times a day 100/25 mg to see if she feels that her movements are responding that she has more fluidity. The patient does not have primary tremor. I also entertained the possibility that she has multisystem atrophy. She reports not to have fallen or fainted. She has no RLS. No vivid dreams,   She is hesitant to try medication. She would rather try an exercise regimen to see if her bradykinesia responds and she is open to visit with movement specialists Dr. Rexene Alberts or Dr. Carles Collet  for a second opinion. She agreed to a 30 day trial of medication, 100-25 mg and agreed to twice a day instead of TID.    I have followed Sarah Montoya since December 2013 for hypersomnia and had worked her up for possible sleep apnea. In December 2016 and 2017 she followed up with my nurse practitioner Cecille Rubin. She reports that her primary care physician, Dr. Josetta Huddle, so-called walking outside the office and had noted that she walked slowly. In addition she has a very hoarse voice and she does have a reduced facial expression, rare blink. She has an astonished facial expression.  She carries the following  diagnoses, hypothyroidism, migraine headaches, basal cell cancer of the nose surgically removed in August 2014 by Dr. Rolm Bookbinder. Left shoulder pain followed by Dr. Flossie Dibble, and she is followed for sports medicine by Dr. Oneida Alar. Social history:  the patient social history she has never used tobacco products she uses rarely on occasion alcohol, has no history of regression or drug use, she is married has 3 healthy children, she works as a Financial risk analyst, she has been drinking  coffee but only in the morning, she quit afternoon coffee due to headaches are provoked.   Review of Systems: Out of a complete 14 system review, the patient complains of only the following symptoms, and all other reviewed systems are negative. EDS controlled under adderall, takes it prn for driving, for conferences, tests etc.   Lost arm swing in the left arm- attributed to shoulder injury, Thumb  Tremor ( pill rolling ) .  Low BP, and improved ( previously lost ) of right  armswing, bradykinesia.    Social History   Socioeconomic History  . Marital status: Married    Spouse name: Not on file  . Number of children: 3  . Years of education: college  . Highest education level: Not on file  Occupational History  . Occupation: Art therapist    Comment: [employer]  Social Needs  . Financial resource strain: Not on file  . Food insecurity:    Worry: Not on file    Inability: Not on file  . Transportation needs:    Medical: Not on file    Non-medical: Not on file  Tobacco Use  . Smoking status: Never Smoker  . Smokeless tobacco: Never Used  Substance and Sexual Activity  . Alcohol use: Yes    Alcohol/week: 1.0 - 1.5 oz    Types: 2 - 3 Standard drinks or equivalent per week    Comment: 4 glasses of wine weekly  . Drug use: No  . Sexual activity: Yes    Partners: Male    Birth control/protection: Post-menopausal  Lifestyle  . Physical activity:    Days per week: Not on file    Minutes per session: Not on file  . Stress: Not on file  Relationships  . Social connections:    Talks on phone: Not on file    Gets together: Not on file    Attends religious service: Not on file    Active member of club or organization: Not on file    Attends meetings of clubs or organizations: Not on file    Relationship status: Not on file  . Intimate partner violence:    Fear of current or ex partner: Not on file    Emotionally abused: Not on file    Physically abused: Not on file     Forced sexual activity: Not on file  Other Topics Concern  . Not on file  Social History Narrative   See previous notes.    Family History  Problem Relation Age of Onset  . Breast cancer Mother 14  . Osteoporosis Mother   . Prostate cancer Father   . Thyroid disease Father   . Mental retardation Sister     Past Medical History:  Diagnosis Date  . Basal cell carcinoma   . Drowsiness    excessive daytime  . Hx of migraines   . Hypersomnia 08/26/2013  . Hypothyroidism   . Hypothyroidism 10/21/2014  . Narcolepsy     Past Surgical History:  Procedure Laterality Date  .  MENISCUS REPAIR Right 05/2014      Allergies as of 04/01/2018 - Review Complete 04/01/2018  Allergen Reaction Noted  . Ciprofloxacin Diarrhea 06/11/2017  . Codeine Nausea Only 06/11/2017    Vitals: BP 106/72   Pulse 89   Ht 5\' 5"  (1.651 m)   Wt 125 lb (56.7 kg)   LMP 07/03/2008   BMI 20.80 kg/m  Last Weight:  Wt Readings from Last 1 Encounters:  04/01/18 125 lb (56.7 kg)   UDJ:SHFW mass index is 20.8 kg/m.     Last Height:   Ht Readings from Last 1 Encounters:  04/01/18 5\' 5"  (1.651 m)    Mallampati 3 , neck circumference 13 inches. Trunk: BMI is 21. The patient's posture is erect , not stooped.     Physical exam:  General: The patient is awake, alert and appears not in acute distress. The patient is well groomed. Smiling.  Head: Normocephalic, atraumatic. Neck is supple.Retrognathia is seen. Nasal septal deviation noted.  Cardiovascular:  Regular rate and rhythm, without  murmurs or carotid bruit, and without distended neck veins. Respiratory: Lungs are clear to auscultation. Skin:  Without evidence of edema, but hands and feet are bluish discoloured, she is cold intolerant.   Has low BP.    Neurologic exam : The patient is awake and alert, oriented to place and time.   Memory subjective described as intact.   Attention span & concentration ability appears normal.  Speech with  dysphonia - hoarse , not aphasia. No dysphagia.    Mood and affect are anxious. Cranial nerves: Pupils are equal and briskly reactive to light. Funduscopic exam without  evidence of pallor or edema. Extraocular movements  in vertical and horizontal planes intact and without nystagmus. Visual fields by finger perimetry are intact. The facial expression is frozen- almost astonished, in disbelief. Wide open eyes.  Hearing to finger rub intact.  Facial sensation intact to fine touch. Facial motor strength is symmetric and tongue and uvula move midline. Shoulder shrug was symmetrical.   Motor exam: increased biceps tone, with cog wheeling.  muscle bulk is symmetric and less strength in left hand. She noticed when typing.  She is Dominantly right handed. Her handwriting has not changed.  Coordination: Rapid alternating movements in the fingers/hands was intact- Finger-to-nose maneuver normal without evidence of ataxia, dysmetria or tremor. Gait and station: Patient walks without assistive device and is able unassisted to climb up to the exam table. Strength within normal limits. Stance is stable and normal based .  Tandem gait is unfragmented. She turns with 3 Steps.  Lack of left hand armswing, keeps thumb and index finger tight together - Sensory:  Proprioception tested in the upper extremities was normal. Deep tendon reflexes: in the  upper and lower extremities are symmetric and intact. Babinski maneuver response is  downgoing.  Assessment:  After physical and neurologic examination, review of laboratory studies,  Personal review of imaging studies, reports of other /same  Imaging studies, results of polysomnography and / or neurophysiology testing and pre-existing records as far as provided in visit., my assessment ist:  1)  Abnormal DAT scan confirms non tremor dominant form of PD- Patient was very reluctant to accept this but has meanwhile implemented regular medication, exercise and is seen  alternately by Dr Hillery Hunter, who  confirmed the diagnosis. [facility] is her GYM.   Sarah Montoya is a noticeable absent left-sided arm swing when walking, she has noticed a tremor in her left thumb. She can provide  normal arm swing if she walks and holds on to a squeeze ball. She does not have sign of shuffling and she turns with 3 steps, walks without assistive device. Dr Nicholes Stairs confirmed constricted movements in left arm due to an exercise injury.  She still has neither fallen nor reporting any RLS symptoms or REM BD.  Continuously taking  on Sinemet 25/100 mg. TID. 45 minutes before meals.  Azilect ( rasiligiline )  1 mg daily -po  Exercises with Alfonse Spruce.  Adderall 10 mg prn - for energy. Naproxen prn   Larey Seat, MD 1/74/9449, 6:75 PM  Certified in Neurology by ABPN Certified in [place] by [facility] [address], [place], [place] [address]

## 2018-04-01 NOTE — Patient Instructions (Signed)
Please ask Dr. Inda Merlin to draw LFTs/ transaminases with your yearly visit and share the results with [place].

## 2018-04-17 DIAGNOSIS — G4710 Hypersomnia, unspecified: Secondary | ICD-10-CM | POA: Diagnosis not present

## 2018-04-17 DIAGNOSIS — G43909 Migraine, unspecified, not intractable, without status migrainosus: Secondary | ICD-10-CM | POA: Diagnosis not present

## 2018-04-17 DIAGNOSIS — Z1389 Encounter for screening for other disorder: Secondary | ICD-10-CM | POA: Diagnosis not present

## 2018-04-17 DIAGNOSIS — G20 Parkinson's disease: Secondary | ICD-10-CM | POA: Diagnosis not present

## 2018-04-17 DIAGNOSIS — R258 Other abnormal involuntary movements: Secondary | ICD-10-CM | POA: Diagnosis not present

## 2018-04-17 DIAGNOSIS — E559 Vitamin D deficiency, unspecified: Secondary | ICD-10-CM | POA: Diagnosis not present

## 2018-04-17 DIAGNOSIS — Z79899 Other long term (current) drug therapy: Secondary | ICD-10-CM | POA: Diagnosis not present

## 2018-04-17 DIAGNOSIS — M7741 Metatarsalgia, right foot: Secondary | ICD-10-CM | POA: Diagnosis not present

## 2018-04-17 DIAGNOSIS — E039 Hypothyroidism, unspecified: Secondary | ICD-10-CM | POA: Diagnosis not present

## 2018-04-17 DIAGNOSIS — Z0000 Encounter for general adult medical examination without abnormal findings: Secondary | ICD-10-CM | POA: Diagnosis not present

## 2018-06-23 ENCOUNTER — Telehealth: Payer: Self-pay | Admitting: Obstetrics & Gynecology

## 2018-06-23 ENCOUNTER — Other Ambulatory Visit: Payer: Self-pay | Admitting: Neurology

## 2018-06-23 ENCOUNTER — Other Ambulatory Visit: Payer: Self-pay | Admitting: Obstetrics & Gynecology

## 2018-06-23 DIAGNOSIS — E2839 Other primary ovarian failure: Secondary | ICD-10-CM

## 2018-06-23 DIAGNOSIS — Z1231 Encounter for screening mammogram for malignant neoplasm of breast: Secondary | ICD-10-CM

## 2018-06-23 DIAGNOSIS — M8580 Other specified disorders of bone density and structure, unspecified site: Secondary | ICD-10-CM

## 2018-06-23 MED ORDER — AMPHETAMINE-DEXTROAMPHETAMINE 10 MG PO TABS
ORAL_TABLET | ORAL | 0 refills | Status: DC
Start: 2018-06-23 — End: 2018-07-31

## 2018-06-23 NOTE — Telephone Encounter (Signed)
Call to patient. Advised order had been placed for BMD. Patient states she will call [facility] to schedule.   Routing to provider for final review. Patient agreeable to disposition. Will close encounter.

## 2018-06-23 NOTE — Telephone Encounter (Signed)
Patient called and requested an order for a bone density test to [facility]. She said her old one expired. Patient requests a call back once this has been done.

## 2018-06-25 ENCOUNTER — Ambulatory Visit
Admission: RE | Admit: 2018-06-25 | Discharge: 2018-06-25 | Disposition: A | Discharge status: Home / Self Care | Payer: Medicare Other | Source: Ambulatory Visit | Attending: Obstetrics & Gynecology | Admitting: Obstetrics & Gynecology

## 2018-06-25 DIAGNOSIS — Z1231 Encounter for screening mammogram for malignant neoplasm of breast: Secondary | ICD-10-CM

## 2018-06-25 DIAGNOSIS — E2839 Other primary ovarian failure: Secondary | ICD-10-CM

## 2018-06-25 DIAGNOSIS — M8580 Other specified disorders of bone density and structure, unspecified site: Secondary | ICD-10-CM

## 2018-06-25 DIAGNOSIS — Z780 Asymptomatic menopausal state: Secondary | ICD-10-CM | POA: Diagnosis not present

## 2018-06-25 DIAGNOSIS — M8589 Other specified disorders of bone density and structure, multiple sites: Secondary | ICD-10-CM | POA: Diagnosis not present

## 2018-06-27 ENCOUNTER — Ambulatory Visit: Payer: Medicare Other

## 2018-07-16 ENCOUNTER — Other Ambulatory Visit: Payer: Medicare Other

## 2018-07-18 ENCOUNTER — Encounter: Payer: Self-pay | Admitting: Obstetrics & Gynecology

## 2018-07-18 ENCOUNTER — Other Ambulatory Visit (HOSPITAL_COMMUNITY)
Admission: RE | Admit: 2018-07-18 | Discharge: 2018-07-18 | Disposition: A | Discharge status: Home / Self Care | Payer: Medicare Other | Source: Ambulatory Visit | Attending: Obstetrics & Gynecology | Admitting: Obstetrics & Gynecology

## 2018-07-18 ENCOUNTER — Ambulatory Visit (INDEPENDENT_AMBULATORY_CARE_PROVIDER_SITE_OTHER): Payer: Medicare Other | Admitting: Obstetrics & Gynecology

## 2018-07-18 ENCOUNTER — Other Ambulatory Visit: Payer: Self-pay

## 2018-07-18 VITALS — BP 106/60 | HR 92 | Resp 16 | Ht 64.75 in | Wt 128.2 lb

## 2018-07-18 DIAGNOSIS — Z01419 Encounter for gynecological examination (general) (routine) without abnormal findings: Secondary | ICD-10-CM

## 2018-07-18 DIAGNOSIS — Z124 Encounter for screening for malignant neoplasm of cervix: Secondary | ICD-10-CM | POA: Insufficient documentation

## 2018-07-18 MED ORDER — NONFORMULARY OR COMPOUNDED ITEM: 1 refills | Status: DC

## 2018-07-18 NOTE — Patient Instructions (Signed)
500mg  calcium with Vit D 400-800 IU daily.

## 2018-07-18 NOTE — Progress Notes (Signed)
67 y.o. G3P3 MarriedCaucasianF here for annual exam.  Was diagnosed with Parkinson's last summer.  Started on Sinemet IR and rasagiline.  Feels her symptoms from last year are much better.  Exercising more regularly.  Did see specialist at [facility].  Will be seen yearly.    Denies vaginal bleeding.    Desires to continue with topical testosterone.    Patient's last menstrual period was 07/03/2008.          Sexually active: Yes.    The current method of family planning is post menopausal status.    Exercising: Yes.    aerobics, swimming, walking Smoker:  no  Health Maintenance: Pap:  01/13/16 Neg   09/21/14 Neg  History of abnormal Pap:  no MMG:  06/25/18 BIRADS1:neg  Colonoscopy:  02/2017 Normal. BMD:   06/25/18 Osteopenia  TDaP:  2017 Pneumonia vaccine(s):  2016 Shingrix: Has done the Zostavax Hep C testing: PCP Screening Labs: PCP   reports that she has never smoked. She has never used smokeless tobacco. She reports that she drinks about 2.0 - 3.0 standard drinks of alcohol per week. She reports that she does not use drugs.  Past Medical History:  Diagnosis Date  . Basal cell carcinoma   . Drowsiness    excessive daytime  . Hx of migraines   . Hypersomnia 08/26/2013  . Hypothyroidism   . Hypothyroidism 10/21/2014  . Narcolepsy   . Parkinson disease [facility])     Past Surgical History:  Procedure Laterality Date  . MENISCUS REPAIR Right 05/2014    Current Outpatient Medications  Medication Sig Dispense Refill  . amphetamine-dextroamphetamine (ADDERALL) 10 MG tablet Take 1 tablet daily by mouth before breakfast. Please call [phone] to schedule f/u around 10/02/18 30 tablet 0  . carbidopa-levodopa (SINEMET IR) 25-100 MG tablet Take 1 tablet 3 (three) times daily by mouth. 270 tablet 2  . cholecalciferol (VITAMIN D) 1000 UNITS tablet Take 1,000 Units by mouth daily.    . clobetasol ointment (TEMOVATE) 0.05 % Apply topically twice daily for no more than 7 days. 45 g 1  .  Lactase (LACTAID PO) Take by mouth as needed.    Marland Kitchen levothyroxine (SYNTHROID, LEVOTHROID) 88 MCG tablet 88 mcg daily.    . NONFORMULARY OR COMPOUNDED ITEM Estradiol 0.02% in 56ml prefilled syringes.  One syringe pv two to three times weekly. 36 each 4  . rasagiline (AZILECT) 1 MG TABS tablet Take 1 tablet (1 mg total) by mouth daily. 90 tablet 3  . SUMAtriptan (IMITREX) 100 MG tablet 100 mg as needed.    . vitamin C (ASCORBIC ACID) 500 MG tablet Take by mouth.     No current facility-administered medications for this visit.     Family History  Problem Relation Age of Onset  . Breast cancer Mother 68  . Osteoporosis Mother   . Prostate cancer Father   . Thyroid disease Father   . Mental retardation Sister     Review of Systems  All other systems reviewed and are negative.   Exam:   BP 106/60 (BP Location: Right Arm, Patient Position: Sitting, Cuff Size: Normal)   Pulse 92   Resp 16   Ht 5' 4.75" (1.645 m)   Wt 128 lb 3.2 oz (58.2 kg)   LMP 07/03/2008   BMI 21.50 kg/m    Height: 5' 4.75" (164.5 cm)  Ht Readings from Last 3 Encounters:  07/18/18 5' 4.75" (1.645 m)  04/01/18 5\' 5"  (1.651 m)  10/02/17 5\' 5"  (  1.651 m)    General appearance: alert, cooperative and appears stated age Head: Normocephalic, without obvious abnormality, atraumatic Neck: no adenopathy, supple, symmetrical, trachea midline and thyroid normal to inspection and palpation Lungs: clear to auscultation bilaterally Breasts: normal appearance, no masses or tenderness Heart: regular rate and rhythm Abdomen: soft, non-tender; bowel sounds normal; no masses,  no organomegaly Extremities: extremities normal, atraumatic, no cyanosis or edema Skin: Skin color, texture, turgor normal. No rashes or lesions Lymph nodes: Cervical, supraclavicular, and axillary nodes normal. No abnormal inguinal nodes palpated Neurologic: Grossly normal   Pelvic: External genitalia:  no lesions              Urethra:  normal  appearing urethra with no masses, tenderness or lesions              Bartholins and Skenes: normal                 Vagina: normal appearing vagina with normal color and discharge, no lesions              Cervix: no lesions              Pap taken: Yes.   Bimanual Exam:  Uterus:  normal size, contour, position, consistency, mobility, non-tender              Adnexa: normal adnexa and no mass, fullness, tenderness               Rectovaginal: Confirms               Anus:  normal sphincter tone, no lesions  Chaperone was present for exam.  A:  Well Woman with normal exam PMP, no HRT H/O LS&A Decreased libido Hypothyroidism Parkinson's H/O migraines with aura  P:   Mammogram guidelines reviewed pap smear obtained RF For estradiol cream and topical testosterone will be sent to [facility].   Lab work and vaccines UTD with Dr. Inda Merlin.  Has not received the Shingrix vaccination.  Is considering this. Release of records from [facility] signed today for most recent colonoscopy. return annually or prn

## 2018-07-22 LAB — CYTOLOGY - PAP: DIAGNOSIS: NEGATIVE

## 2018-07-31 ENCOUNTER — Other Ambulatory Visit: Payer: Self-pay | Admitting: Neurology

## 2018-07-31 MED ORDER — AMPHETAMINE-DEXTROAMPHETAMINE 10 MG PO TABS: ORAL_TABLET | ORAL | 0 refills | Status: DC

## 2018-08-07 DIAGNOSIS — Z85828 Personal history of other malignant neoplasm of skin: Secondary | ICD-10-CM | POA: Diagnosis not present

## 2018-08-07 DIAGNOSIS — L910 Hypertrophic scar: Secondary | ICD-10-CM | POA: Diagnosis not present

## 2018-08-27 DIAGNOSIS — Z23 Encounter for immunization: Secondary | ICD-10-CM | POA: Diagnosis not present

## 2018-09-08 ENCOUNTER — Other Ambulatory Visit: Payer: Self-pay | Admitting: Neurology

## 2018-09-09 MED ORDER — AMPHETAMINE-DEXTROAMPHETAMINE 10 MG PO TABS: ORAL_TABLET | ORAL | 0 refills | Status: DC

## 2018-09-17 ENCOUNTER — Encounter: Payer: Self-pay | Admitting: Neurology

## 2018-09-22 NOTE — Telephone Encounter (Signed)
Hi Tyler Aas- yes, 10-22-2018 is fine, if it wasn't blocked for peer to peer review committee.  I will send a FYI to Dr Inda Merlin,   Thank you. Larey Seat, MD

## 2018-09-24 ENCOUNTER — Encounter: Payer: Self-pay | Admitting: Neurology

## 2018-09-24 ENCOUNTER — Other Ambulatory Visit: Payer: Self-pay | Admitting: Neurology

## 2018-09-24 DIAGNOSIS — G20 Parkinson's disease: Secondary | ICD-10-CM | POA: Diagnosis not present

## 2018-09-24 DIAGNOSIS — Z79899 Other long term (current) drug therapy: Secondary | ICD-10-CM | POA: Diagnosis not present

## 2018-09-26 ENCOUNTER — Other Ambulatory Visit: Payer: Self-pay | Admitting: Neurology

## 2018-09-26 ENCOUNTER — Telehealth: Payer: Self-pay | Admitting: *Deleted

## 2018-09-26 NOTE — Telephone Encounter (Signed)
Pt called and spoke with phone staff, concerned she didn't have refills for Azilect at [facility] on [address]. RN called [facility]. They were able to find the prescription that was written on 04/01/18, #90, refills 3. Pt had filled it in [place] but April (@ [place]) stated they would be able to transfer the prescription back to [place] on [address] and fill it for pt.

## 2018-10-21 ENCOUNTER — Other Ambulatory Visit: Payer: Self-pay | Admitting: Obstetrics & Gynecology

## 2018-10-21 ENCOUNTER — Other Ambulatory Visit: Payer: Self-pay | Admitting: Neurology

## 2018-10-21 MED ORDER — AMPHETAMINE-DEXTROAMPHETAMINE 10 MG PO TABS: ORAL_TABLET | ORAL | 0 refills | Status: DC

## 2018-10-21 NOTE — Telephone Encounter (Signed)
Medication refill request: Clobetasol ointment Last AEX:  07-18-18 Next AEX: 11-03-19 Last MMG (if hormonal medication request): 06-25-18 3D [id number] Refill authorized: please advise

## 2018-10-22 ENCOUNTER — Ambulatory Visit: Payer: Self-pay | Admitting: Neurology

## 2018-12-03 ENCOUNTER — Other Ambulatory Visit: Payer: Self-pay | Admitting: Neurology

## 2018-12-03 MED ORDER — AMPHETAMINE-DEXTROAMPHETAMINE 10 MG PO TABS: ORAL_TABLET | ORAL | 0 refills | Status: DC

## 2018-12-09 ENCOUNTER — Encounter: Payer: Self-pay | Admitting: Neurology

## 2018-12-09 ENCOUNTER — Telehealth: Payer: Self-pay | Admitting: Neurology

## 2018-12-09 NOTE — Telephone Encounter (Signed)
Attempted to complete PA on cover my meds and it states the information the patient gave me was no correct. Will attempt again. If not able will call.

## 2018-12-09 NOTE — Telephone Encounter (Deleted)
PA submitted through cover my meds/ [employer]. KEY: [id number]

## 2018-12-10 DIAGNOSIS — L03011 Cellulitis of right finger: Secondary | ICD-10-CM | POA: Diagnosis not present

## 2018-12-10 NOTE — Telephone Encounter (Signed)
Called the number listed on the form I received and completed the PA over phone since was unable to complete online. PA completed and should hear a response within 24 hours.

## 2018-12-10 NOTE — Telephone Encounter (Signed)
Patient calling about Adderall auth. She was not able to get meds at pharmacy since not authorized. Insurance company says that they faxed a form to [place] today. Please call her

## 2018-12-10 NOTE — Telephone Encounter (Signed)
Called the patient back to make her aware the medication was approved through 11/19/2019 through [facility].

## 2018-12-15 ENCOUNTER — Other Ambulatory Visit: Payer: Self-pay | Admitting: Neurology

## 2018-12-15 DIAGNOSIS — G20 Parkinson's disease: Secondary | ICD-10-CM

## 2018-12-18 DIAGNOSIS — Z85828 Personal history of other malignant neoplasm of skin: Secondary | ICD-10-CM | POA: Diagnosis not present

## 2018-12-18 DIAGNOSIS — L910 Hypertrophic scar: Secondary | ICD-10-CM | POA: Diagnosis not present

## 2018-12-18 DIAGNOSIS — L814 Other melanin hyperpigmentation: Secondary | ICD-10-CM | POA: Diagnosis not present

## 2018-12-18 DIAGNOSIS — L570 Actinic keratosis: Secondary | ICD-10-CM | POA: Diagnosis not present

## 2018-12-18 DIAGNOSIS — D1801 Hemangioma of skin and subcutaneous tissue: Secondary | ICD-10-CM | POA: Diagnosis not present

## 2018-12-18 DIAGNOSIS — L821 Other seborrheic keratosis: Secondary | ICD-10-CM | POA: Diagnosis not present

## 2018-12-18 DIAGNOSIS — L820 Inflamed seborrheic keratosis: Secondary | ICD-10-CM | POA: Diagnosis not present

## 2019-01-13 ENCOUNTER — Other Ambulatory Visit: Payer: Self-pay | Admitting: Neurology

## 2019-01-13 MED ORDER — AMPHETAMINE-DEXTROAMPHETAMINE 10 MG PO TABS: ORAL_TABLET | ORAL | 0 refills | Status: DC

## 2019-01-20 ENCOUNTER — Telehealth: Payer: Self-pay | Admitting: Neurology

## 2019-01-20 NOTE — Telephone Encounter (Signed)
Christy/[employer] [phone] REF# [id number] called needs more information for PA. She is faxing a form to [phone]  [facility]

## 2019-01-28 ENCOUNTER — Encounter: Payer: Self-pay | Admitting: Neurology

## 2019-02-03 NOTE — Telephone Encounter (Signed)
PA completed and was denied, completed appeal and sent the appeal on 01/29/2019 for tier exception. I was able to get approved 11/19/2018-11/19/2019 through [place].  REF # [id number]

## 2019-02-22 ENCOUNTER — Encounter: Payer: Self-pay | Admitting: Neurology

## 2019-02-25 ENCOUNTER — Other Ambulatory Visit: Payer: Self-pay | Admitting: Neurology

## 2019-02-25 MED ORDER — AMPHETAMINE-DEXTROAMPHETAMINE 10 MG PO TABS: ORAL_TABLET | ORAL | 0 refills | Status: DC

## 2019-03-19 ENCOUNTER — Telehealth: Payer: Self-pay | Admitting: Neurology

## 2019-03-19 ENCOUNTER — Encounter: Payer: Self-pay | Admitting: Neurology

## 2019-03-19 NOTE — Telephone Encounter (Signed)
Called the patient to review their chart and made sure that everything was up to date. Patient informed the email was sent for the [email or web address] link. Instructed to make sure they hold on to the e-mail for the upcoming appointment as it is necessary to access their appointment. Instructed the patient that apx 30 min prior to the appointment the front staff will contact them to make sure they are ready to go for their appointment in case there is any need for troubleshooting it can be completed prior to the appointment time.

## 2019-03-19 NOTE — Telephone Encounter (Signed)
03-19-2019 Pt has called and gave verbal consent to file insurance for [email or web address] VV for 03-25-2019 pt email address [email or web address]  Pt understands that although there may be some limitations with this type of visit, we will take all precautions to reduce any security or privacy concerns.  Pt understands that this will be treated like an in office visit and we will file with pt's insurance, and there may be a patient responsible charge related to this service.

## 2019-03-19 NOTE — Addendum Note (Signed)
Addended by: Darleen Crocker on: 03/19/2019 03:45 PM   Modules accepted: Orders

## 2019-03-22 ENCOUNTER — Encounter: Payer: Self-pay | Admitting: Neurology

## 2019-03-23 ENCOUNTER — Other Ambulatory Visit: Payer: Self-pay | Admitting: Neurology

## 2019-03-23 DIAGNOSIS — G20 Parkinson's disease: Secondary | ICD-10-CM

## 2019-03-25 ENCOUNTER — Other Ambulatory Visit: Payer: Self-pay

## 2019-03-25 ENCOUNTER — Ambulatory Visit (INDEPENDENT_AMBULATORY_CARE_PROVIDER_SITE_OTHER): Payer: Medicare Other | Admitting: Neurology

## 2019-03-25 ENCOUNTER — Encounter: Payer: Self-pay | Admitting: Neurology

## 2019-03-25 DIAGNOSIS — G20 Parkinson's disease: Secondary | ICD-10-CM

## 2019-03-25 DIAGNOSIS — F1921 Other psychoactive substance dependence, in remission: Secondary | ICD-10-CM

## 2019-03-25 MED ORDER — AMPHETAMINE-DEXTROAMPHETAMINE 10 MG PO TABS: ORAL_TABLET | ORAL | 0 refills | Status: DC

## 2019-03-25 NOTE — Patient Instructions (Signed)
Rasagiline Oral Tablets What is this medicine? RASAGILINE (ra SA ji leen) is a monoamine oxidase inhibitor (MAOI). It is used to treat Parkinson's disease. This medicine may be used for other purposes; ask your health care provider or pharmacist if you have questions. COMMON BRAND NAME(S): Azilect What should I tell my health care provider before I take this medicine? They need to know if you have any of these conditions: -high or low blood pressure -history of stroke -if you drink alcohol -liver disease -narcolepsy or other sleep disorder -schizophrenia -skin cancer -taken an MAOI like Carbex, Eldepryl, Xadago, Marplan, Nardil, or Parnate in last 14 days -an unusual or allergic reaction to rasagiline, other medicines, foods, dyes, or preservatives -pregnant or trying to get pregnant -breast-feeding How should I use this medicine? Take this medicine by mouth with a glass of water. Follow the directions on the prescription label. Take your doses at regular intervals. Do not take your medicine more often than directed. Do not stop taking except on your doctor's advice. Talk to your pediatrician regarding the use of this medicine in children. Special care may be needed. Overdosage: If you think you have taken too much of this medicine contact a poison control center or emergency room at once. NOTE: This medicine is only for you. Do not share this medicine with others. What if I miss a dose? If you miss a dose, take it as soon as you can. If it is almost time for your next dose, take only that dose. Do not take double or extra doses. What may interact with this medicine? Do not take this medicine with any of the following medications: -certain medicines for depression -cyclobenzaprine -dextromethorphan -linezolid -MAOIs like Carbex, Eldepryl, Xadago, Marplan, Nardil, and Parnate -meperidine -methadone -methylene blue -propoxyphene -stimulant medicines for attention disorders, weight  loss, or to stay awake -St. John's Wort -tramadol -tryptophan This medicine may also interact with the following medications: -alcohol -amiodarone -antibiotics like ciprofloxacin, norfloxacin -antihistamines for allergy, cough and cold -carbamazepine -certain medicines for sleep -cimetidine -decongestants, including nasal sprays or eye drops -female hormones, like estrogens -furazolidone -isoniazid -medicines for anxiety or psychotic disturbances -medicines for sleep during surgery -mexiletine -narcotic medicines for pain -procarbazine -theophylline -tizanidine -yohimbine -zileuton This list may not describe all possible interactions. Give your health care provider a list of all the medicines, herbs, non-prescription drugs, or dietary supplements you use. Also tell them if you smoke, drink alcohol, or use illegal drugs. Some items may interact with your medicine. What should I watch for while using this medicine? Tell your doctor or healthcare professional if your symptoms do not start to get better or if they get worse. Visit your doctor or health care professional for regular checks on your progress. Do not stop this medicine suddenly. Ask your doctor or health care professional for advice about gradually reducing your dose. You may get drowsy, dizzy, or have blurred vision. Do not drive, use machinery, work in high places, or do anything that needs mental alertness until you know how this medicine affects you. Do not stand or sit up quickly, especially if you are an older patient. This reduces the risk of dizzy or fainting spells. Alcohol may increase dizziness or drowsiness. Talk to your doctor before drinking alcoholic beverages while taking this medicine. It is possible to suddenly fall asleep or suddenly feel like falling asleep during usual activities of daily living (e.g., cooking, driving a car, talking on the phone, eating, working) while you are taking this  medicine. This may  result in having accidents, which can be severe. Your chances of falling asleep while doing normal activities and taking this medicine are greater if you take other medicines that cause drowsiness. If you find that you suddenly fall asleep or suddenly feel like falling asleep during usual daily activities, you should not drive or participate in potentially dangerous activities and you should contact your doctor right away. You should not drive, operate machinery, or work at heights during treatment with this medicine if you have ever experienced severe drowsiness and/or have fallen asleep without warning before using this medicine. While taking this medicine, you may feel increased sexual urges, or other strong urges to gamble, spend money, or binge eat, and be unable to control these urges. If you or your family notice these or other strong urges while you are taking this medicine, you should report this to your health care provider as soon as possible. Foods that contain very high amounts of tyramine, such as aged, fermented, cured, smoked and pickled foods, should be avoided while taking this medicine. The combination may cause a dangerous rise in blood pressure. Ask your doctor or health care professional, pharmacist, or nutritionist for a complete listing of foods and beverages that are high in tyramine. If you consume a food or beverage very rich in tyramine and do not feel well soon after eating, contact your health care provider. Some medicines may interact with this medicine and could cause adverse effects. Talk to your doctor if you are taking or planning to take any over-the-counter drugs, especially cough remedies or decongestants, including nasal sprays or eye drops. This medicine may also interact with antidepressants and certain medicines for pain. Contact your health care provider before taking new medications including antidepressants, pain medicines, or prescription or over-the-counter medicines  for congestion, cough, colds, or allergies. If you are scheduled for any medical or dental procedure, tell your healthcare provider that you are taking this medicine. This medicine can interact with other medicines used during surgery. This medicine may increase your risk of getting skin cancer. Report any irregular moles, dark or white spots, or sores that do not heal. Have your skin checked for skin cancer regularly. What side effects may I notice from receiving this medicine? Side effects that you should report to your doctor or health care professional as soon as possible: -allergic reactions like skin rash, itching or hives, swelling of the face, lips, or tongue -changes in vision -falls -hallucination, loss of contact with reality -high blood pressure -signs and symptoms of hypertensive crisis like severe headache with confusion and blurred vision, severe chest pain, shortness of breath, nausea and vomiting, seizures -signs and symptoms of low blood pressure like dizziness; feeling faint or lightheaded, falls; unusually weak or tired -signs and symptoms of serotonin syndrome like confusion, heavy sweating, fever, involuntary muscle jerking, tremor, rigid muscles, unstable blood pressure, diarrhea -suddenly falling asleep or sudden feelings of wanting to fall asleep during usual activities like driving or eating -suicidal thoughts or other mood changes -uncontrolled, sudden body movements (dyskinesia) -unusual or obvious changes in behavior such as agitation, confusion, disorganized thinking Side effects that usually do not require medical attention (report to your doctor or health care professional if they continue or are bothersome): -dry mouth -nausea -trouble sleeping -upset stomach This list may not describe all possible side effects. Call your doctor for medical advice about side effects. You may report side effects to FDA at 1-800-FDA-1088. Where should I keep my  medicine? Keep  out of the reach of children. Store at room temperature between 15 and 30 degrees C (59 and 86 degrees F). Throw away any unused medicine after the expiration date. NOTE: This sheet is a summary. It may not cover all possible information. If you have questions about this medicine, talk to your doctor, pharmacist, or health care provider.  2019 Elsevier/Gold Standard (2016-02-20 10:39:14)

## 2019-03-25 NOTE — Progress Notes (Signed)
Virtual Visit via Video Note  I connected with Sarah Montoya on 03/25/19 at  1:30 PM EDT by a video enabled telemedicine application and verified that I am speaking with the correct person using two identifiers.  Location: Patient: at home , Provider: at the office    I discussed the limitations of evaluation and management by telemedicine and the availability of in person appointments. The patient expressed understanding and agreed to proceed.  History of Present Illness: established PD patient, Mrs. Sarah Montoya was last seen in person by myself on 01 Apr 2018.  She has continued to do well she is less fatigued and much less sleepy if she has Adderall available and she takes Adderall not in the morning but after lunch when she usually experiences the worst of fatigue.  She has no hyperkinesis involuntary movements and she reports no freezing episodes.  On the current 3 times a day low-dose carbidopa levodopa regimen she has not developed tremors.  There is no flank pain hip pain back pain, no cramping of muscles but she has significant Raynolds syndrome with bluish palms and fingers.  I have followed Mrs. Sarah Montoya since 2013 when she was referred for hypersomnia by her primary care physician.   All medications have been given at the same dose that she took last year.  She denies any overshooting movements or restless leg symptoms, had no falls and she presents speaking clearly and fluently.  The patient denies any falls, her sleep is normal she has not developed REM sleep behaviors, she does not report visual or auditory hallucinations, she does not have an increased dream pressure and she does not experience nightmares.  She does feel a little bit more anxiety which is not related to her Parkinson's disease but to the current coronavirus crisis.  She is continuing to see alternatingly [facility] and Dr. Eldridge Abrahams at [facility].  Her primary care physician is still  Dr. Josetta Huddle.  She continues to participate in physical activity, her weight has been stable.  She is actually somewhat underweight her BMI fluctuates between 20 and 22.  She continues to take Azilect, 1 mg daily Adderall 10 mg daily as needed for energy by lunchtime" shortly after, 3 times a day Sinemet 25/1 100 mg 45 minutes before each meal.  No refills except for Adderall were needed at this time and the patient will see [place] again in 12 months likely with my nurse practitioner Vaughan Browner.      Observations/Objective:RV on 03-25-2019-the patient presents with a slightly masked facial appearance, and a more verified blink.  She is standing in front of the camera and there is no truncal tremor noticed, once she puts the camera to rest and shows me her hands she seems not to have any resting tremor in her fingers and hands.  She still walks without an assistive device.     Assessment and Plan: I refilled adderall for her. Rv in 12 month in person, no need to refill other meds at this point. We alternate visit with Dr Linus Mako.    Follow Up Instructions: I refilled adderall for 30 days, 10 mg after lunch. No refills except for Adderall were needed at this time and the patient will see [place] again in 12 months likely with my nurse practitioner Vaughan Browner.      I discussed the assessment and treatment plan with the patient. The patient was provided an opportunity to ask questions and all were answered. The  patient agreed with the plan and demonstrated an understanding of the instructions.   The patient was advised to call back or seek an in-person evaluation if the symptoms worsen or if the condition fails to improve as anticipated.  I provided 16 minutes of non-face-to-face time during this encounter.   Larey Seat, MD

## 2019-04-01 ENCOUNTER — Encounter: Payer: Self-pay | Admitting: Neurology

## 2019-04-01 ENCOUNTER — Telehealth: Payer: Self-pay | Admitting: Neurology

## 2019-04-01 ENCOUNTER — Other Ambulatory Visit: Payer: Self-pay

## 2019-04-01 NOTE — Telephone Encounter (Signed)
Will kindly ask Dr Inda Merlin to draw, CC diff, CMET, TIBC and ferritin, Vit b12, ceruloplasmin for this patient.

## 2019-04-01 NOTE — Telephone Encounter (Signed)
Called the patient to make sure that we could get her scheduled for a yr follow up with NP. I have scheduled her for may 10 at 11:30 am with Ward Givens, NP.  Pt is due in a few months to have follow up with PCP and she stated that Dr Brett Fairy wanted her to have him draw a certain lab. She couldn't remember what that was. She would like to know what Dr Brett Fairy would like her PCP to have drawn and she will make sure lab results are sent to [place]. Advised I would ask and reach back out to the patient. Pt was appreciative.

## 2019-04-01 NOTE — Telephone Encounter (Signed)
Medication refill request: estradiol 0.02% in 80ml prefilled syringes Last AEX:  07-18-18 Next AEX: 11-03-2019 Last MMG (if hormonal medication request): 06-25-18 category b density birads 1:neg Refill authorized: please approve

## 2019-04-02 ENCOUNTER — Other Ambulatory Visit: Payer: Self-pay | Admitting: *Deleted

## 2019-04-02 NOTE — Telephone Encounter (Signed)
Duplicated

## 2019-04-02 NOTE — Telephone Encounter (Signed)
[facility] faxes requesting Estradiol and testosterone creams.

## 2019-04-03 ENCOUNTER — Encounter: Payer: Self-pay | Admitting: Obstetrics & Gynecology

## 2019-04-03 ENCOUNTER — Telehealth: Payer: Self-pay

## 2019-04-03 NOTE — Telephone Encounter (Signed)
Sarah Montoya  to Megan Salon, MD       04/03/19 6:33 AM  Dr. Sabra Heck prescribed topical testosterone cream and Estradiol at my last checkup. When I tried to get them refilled this week I was told that they could no longer be refilled. I had assumed she wanted me to continue using them until my next visit in December. Please advise.   Thank you.

## 2019-04-03 NOTE — Telephone Encounter (Signed)
Routing to [place] for review and advise. Appears there is a refill encounter open regarding this as well.

## 2019-04-03 NOTE — Telephone Encounter (Signed)
Telephone encounter created  

## 2019-04-06 MED ORDER — NONFORMULARY OR COMPOUNDED ITEM: 1 refills | Status: DC

## 2019-04-06 MED ORDER — NONFORMULARY OR COMPOUNDED ITEM: 2 refills | Status: DC

## 2019-04-06 NOTE — Telephone Encounter (Signed)
Rx has been completed.  Had to be printed and faxed so could not be done from home.  Thanks.

## 2019-04-06 NOTE — Telephone Encounter (Signed)
rx faxed to [facility], [place]

## 2019-04-06 NOTE — Telephone Encounter (Signed)
Both prescriptions have been faxed to [place]. Encounter closed.

## 2019-04-21 ENCOUNTER — Other Ambulatory Visit: Payer: Self-pay | Admitting: Neurology

## 2019-05-04 ENCOUNTER — Other Ambulatory Visit: Payer: Self-pay | Admitting: Neurology

## 2019-05-04 MED ORDER — AMPHETAMINE-DEXTROAMPHETAMINE 10 MG PO TABS: ORAL_TABLET | ORAL | 0 refills | Status: DC

## 2019-06-02 ENCOUNTER — Other Ambulatory Visit: Payer: Self-pay | Admitting: Neurology

## 2019-06-02 MED ORDER — AMPHETAMINE-DEXTROAMPHETAMINE 10 MG PO TABS: ORAL_TABLET | ORAL | 0 refills | Status: DC

## 2019-06-02 NOTE — Telephone Encounter (Signed)
Filer Database Verified LR: 05-04-2019 Qty: 30 Pending appointment: 03-28-2020 Ward Givens, NP)

## 2019-06-30 ENCOUNTER — Other Ambulatory Visit: Payer: Self-pay | Admitting: Neurology

## 2019-06-30 MED ORDER — AMPHETAMINE-DEXTROAMPHETAMINE 10 MG PO TABS: ORAL_TABLET | ORAL | 0 refills | Status: DC

## 2019-07-16 DIAGNOSIS — Z1159 Encounter for screening for other viral diseases: Secondary | ICD-10-CM | POA: Diagnosis not present

## 2019-07-16 DIAGNOSIS — E039 Hypothyroidism, unspecified: Secondary | ICD-10-CM | POA: Diagnosis not present

## 2019-08-07 ENCOUNTER — Ambulatory Visit: Payer: Medicare Other | Admitting: Obstetrics & Gynecology

## 2019-08-07 IMAGING — NM NM DATSCAN
3 series · 18 of 18 positions shown · non-contrast
Comparison: None.

CLINICAL DATA: Parkinson's disease atypical presentation. Reduced
facial expression. Slow gait. Remote Tonsy resistant.

EXAM:
NUCLEAR MEDICINE BRAIN IMAGING WITH SPECT  (DaTscan )
TECHNIQUE: SPECT images of the brain were obtained after intravenous injection
of radiopharmaceutical. 4 hour post injection imaging. Appropriate
positioning. 0.8 ml lugols solution administered in a.m
RADIOPHARMACEUTICALS:  4.5 millicuries I 123 Ioflupane

[Series 1: spect - (id) _(id) · 4.1mm · 4.14mm/px · 6 of 128 frames shown]
[frame 11/128]
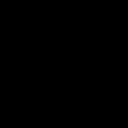
[frame 32/128]
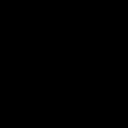
[frame 54/128]
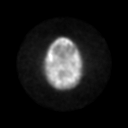
[frame 75/128]
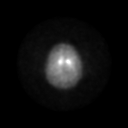
[frame 96/128]
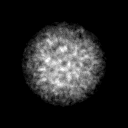
[frame 118/128]
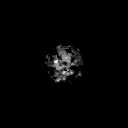

[Series 1: brain spect · 4.14mm/px · 6 of 120 frames shown]
[frame 11/120  full-range]
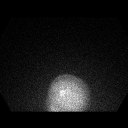
[frame 31/120  full-range]
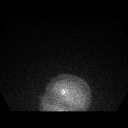
[frame 51/120  full-range]
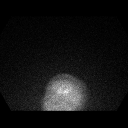
[frame 71/120  full-range]
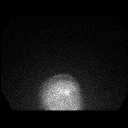
[frame 91/120  full-range]
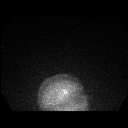
[frame 111/120  full-range]
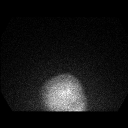

[Series 1: spect - (id) _(id)_cor · 4.1mm · 4.14mm/px · 6 of 128 frames shown]
[frame 11/128]
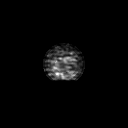
[frame 32/128]
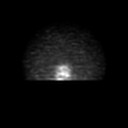
[frame 54/128]
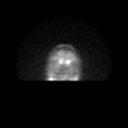
[frame 75/128]
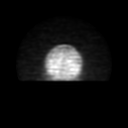
[frame 96/128]
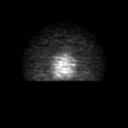
[frame 118/128]
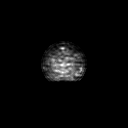

[18 of 18 positions shown; findings below may reference images not displayed]

FINDINGS: There is decreased radio tracer accumulation within the RIGHT
striata compared to the LEFT. This is noticeable within the RIGHT
putamen and head of the caudate. Both the LEFT and RIGHT putamen
appear "shortened" suggesting loss of dopamine transporter in both
the LEFT and RIGHT putamen, but greater on the RIGHT.
IMPRESSION: Decreased radiotracer accumulation within the RIGHT striata compared
to the LEFT is consistent with loss of dopamine transport
populations in the Right striata and most consistent Parkinson's
type syndrome.

## 2019-08-18 ENCOUNTER — Other Ambulatory Visit: Payer: Self-pay

## 2019-08-19 ENCOUNTER — Telehealth: Payer: Self-pay

## 2019-08-19 MED ORDER — AMPHETAMINE-DEXTROAMPHETAMINE 10 MG PO TABS: ORAL_TABLET | ORAL | 0 refills | Status: DC

## 2019-08-19 NOTE — Telephone Encounter (Signed)
Was this send twice ?

## 2019-08-31 DIAGNOSIS — E559 Vitamin D deficiency, unspecified: Secondary | ICD-10-CM | POA: Diagnosis not present

## 2019-08-31 DIAGNOSIS — E039 Hypothyroidism, unspecified: Secondary | ICD-10-CM | POA: Diagnosis not present

## 2019-08-31 DIAGNOSIS — Z79899 Other long term (current) drug therapy: Secondary | ICD-10-CM | POA: Diagnosis not present

## 2019-08-31 DIAGNOSIS — R258 Other abnormal involuntary movements: Secondary | ICD-10-CM | POA: Diagnosis not present

## 2019-08-31 DIAGNOSIS — M7741 Metatarsalgia, right foot: Secondary | ICD-10-CM | POA: Diagnosis not present

## 2019-08-31 DIAGNOSIS — G43909 Migraine, unspecified, not intractable, without status migrainosus: Secondary | ICD-10-CM | POA: Diagnosis not present

## 2019-08-31 DIAGNOSIS — Z1389 Encounter for screening for other disorder: Secondary | ICD-10-CM | POA: Diagnosis not present

## 2019-08-31 DIAGNOSIS — Z23 Encounter for immunization: Secondary | ICD-10-CM | POA: Diagnosis not present

## 2019-08-31 DIAGNOSIS — G4710 Hypersomnia, unspecified: Secondary | ICD-10-CM | POA: Diagnosis not present

## 2019-08-31 DIAGNOSIS — Z0001 Encounter for general adult medical examination with abnormal findings: Secondary | ICD-10-CM | POA: Diagnosis not present

## 2019-08-31 DIAGNOSIS — G20 Parkinson's disease: Secondary | ICD-10-CM | POA: Diagnosis not present

## 2019-09-23 ENCOUNTER — Other Ambulatory Visit: Payer: Self-pay | Admitting: Neurology

## 2019-09-24 MED ORDER — AMPHETAMINE-DEXTROAMPHETAMINE 10 MG PO TABS: ORAL_TABLET | ORAL | 0 refills | Status: DC

## 2019-09-29 ENCOUNTER — Other Ambulatory Visit: Payer: Self-pay

## 2019-09-29 DIAGNOSIS — Z20822 Contact with and (suspected) exposure to covid-19: Secondary | ICD-10-CM

## 2019-09-30 DIAGNOSIS — Z20828 Contact with and (suspected) exposure to other viral communicable diseases: Secondary | ICD-10-CM | POA: Diagnosis not present

## 2019-09-30 LAB — NOVEL CORONAVIRUS, NAA: SARS-CoV-2, NAA: NOT DETECTED

## 2019-10-03 DIAGNOSIS — Z20828 Contact with and (suspected) exposure to other viral communicable diseases: Secondary | ICD-10-CM | POA: Diagnosis not present

## 2019-10-04 DIAGNOSIS — Z20828 Contact with and (suspected) exposure to other viral communicable diseases: Secondary | ICD-10-CM | POA: Diagnosis not present

## 2019-11-03 ENCOUNTER — Ambulatory Visit (INDEPENDENT_AMBULATORY_CARE_PROVIDER_SITE_OTHER): Payer: Medicare Other | Admitting: Obstetrics & Gynecology

## 2019-11-03 ENCOUNTER — Encounter: Payer: Self-pay | Admitting: Obstetrics & Gynecology

## 2019-11-03 ENCOUNTER — Other Ambulatory Visit: Payer: Self-pay

## 2019-11-03 VITALS — BP 118/70 | HR 76 | Temp 97.7°F | Resp 12 | Ht 64.25 in | Wt 133.0 lb

## 2019-11-03 DIAGNOSIS — Z124 Encounter for screening for malignant neoplasm of cervix: Secondary | ICD-10-CM | POA: Diagnosis not present

## 2019-11-03 DIAGNOSIS — Z01419 Encounter for gynecological examination (general) (routine) without abnormal findings: Secondary | ICD-10-CM

## 2019-11-03 MED ORDER — NONFORMULARY OR COMPOUNDED ITEM: 1 refills | Status: DC

## 2019-11-03 MED ORDER — NONFORMULARY OR COMPOUNDED ITEM: 2 refills | Status: DC

## 2019-11-03 NOTE — Progress Notes (Signed)
68 y.o. G3P3 Married White or Caucasian female here for annual exam.  Doing well.  No vaginal bleeding.  Had blood work this year with Dr. Inda Merlin.  She is now on oral B12 and Vit D.  Hasn't been on topical testosterone recently.  Would like to continue so will need rx completed.  Hasn't had testosterone level in about two years but shouldn't do today as has not been using recently.  Does desire to continue being SA and feels this is beneficial.    PCP:  Dr. Inda Merlin  Patient's last menstrual period was 07/03/2008.          Sexually active: Yes.    The current method of family planning is post menopausal status.    Exercising: Yes.    walking, aerobics, and swimming Smoker:  no  Health Maintenance: Pap:    07/18/18 Neg  01/13/16 Neg              09/21/14 Neg  History of abnormal Pap:  no MMG:  06/25/18 BIRADS 1 negative/density b.  Planning on doing this next year. Colonoscopy:  02/14/17 f/u 10 years BMD:   06/25/18 Osteopenia TDaP:  06/12/12  Pneumonia vaccine(s):  07/21/15 Shingrix:   Zostavax completed 07/08/12 Hep C testing: has donated blood Screening Labs: PCP   reports that she has never smoked. She has never used smokeless tobacco. She reports previous alcohol use. She reports that she does not use drugs.  Past Medical History:  Diagnosis Date  . Basal cell carcinoma   . Drowsiness    excessive daytime  . Hx of migraines   . Hypersomnia 08/26/2013  . Hypothyroidism   . Hypothyroidism 10/21/2014  . Narcolepsy   . Parkinson disease [facility])     Past Surgical History:  Procedure Laterality Date  . MENISCUS REPAIR Right 05/2014    Current Outpatient Medications  Medication Sig Dispense Refill  . amphetamine-dextroamphetamine (ADDERALL) 10 MG tablet Take 1 tablet daily by mouth before breakfast. 30 tablet 0  . Calcium-Vitamin D-Vitamin K (VIACTIV PO) Take 2 tablets by mouth.    . carbidopa-levodopa (SINEMET IR) 25-100 MG tablet TAKE 1 TABLET BY MOUTH THREE TIMES DAILY 270 tablet 3  .  Cholecalciferol (D3 HIGH POTENCY) 50 MCG (2000 UT) CAPS     . clobetasol ointment (TEMOVATE) 0.05 % APPLY TOPICALLY TWICE DAILY FOR NO MORE THAN 7 DAYS 45 g 1  . Cyanocobalamin (B-12) 1000 MCG CAPS     . Lactase (LACTAID PO) Take by mouth as needed.    Marland Kitchen levothyroxine (EUTHYROX) 88 MCG tablet Take 88 mcg by mouth daily before breakfast.    . Multiple Vitamin (MULTIVITAMIN) tablet Take 1 tablet by mouth daily.    . NONFORMULARY OR COMPOUNDED ITEM Estradiol 0.02% in 27ml prefilled syringes.  One syringe pv two to three times weekly. 36 each 2  . NONFORMULARY OR COMPOUNDED ITEM Topical testosterone cream 1mg / 0.60ml.  Apply 0.71ml topically two times weekly. #64month supply/1RF 1 each 1  . rasagiline (AZILECT) 1 MG TABS tablet Take 1 tablet by mouth once daily 90 tablet 3  . SUMAtriptan (IMITREX) 100 MG tablet 100 mg as needed.    . vitamin C (ASCORBIC ACID) 500 MG tablet Take by mouth.     No current facility-administered medications for this visit.    Family History  Problem Relation Age of Onset  . Breast cancer Mother 26  . Osteoporosis Mother   . Prostate cancer Father   . Thyroid disease Father   .  Mental retardation Sister     Review of Systems  All other systems reviewed and are negative.   Exam:   BP 118/70 (BP Location: Right Arm, Patient Position: Sitting, Cuff Size: Normal)   Pulse 76   Temp 97.7 F (36.5 C) (Temporal)   Resp 12   Ht 5' 4.25" (1.632 m)   Wt 133 lb (60.3 kg)   LMP 07/03/2008   BMI 22.65 kg/m     Height: 5' 4.25" (163.2 cm)  Ht Readings from Last 3 Encounters:  11/03/19 5' 4.25" (1.632 m)  07/18/18 5' 4.75" (1.645 m)  04/01/18 5\' 5"  (1.651 m)    General appearance: alert, cooperative and appears stated age Head: Normocephalic, without obvious abnormality, atraumatic Neck: no adenopathy, supple, symmetrical, trachea midline and thyroid normal to inspection and palpation Lungs: clear to auscultation bilaterally Breasts: normal appearance, no masses  or tenderness Heart: regular rate and rhythm Abdomen: soft, non-tender; bowel sounds normal; no masses,  no organomegaly Extremities: extremities normal, atraumatic, no cyanosis or edema Skin: Skin color, texture, turgor normal. No rashes or lesions Lymph nodes: Cervical, supraclavicular, and axillary nodes normal. No abnormal inguinal nodes palpated Neurologic: Grossly normal   Pelvic: External genitalia:  no lesions              Urethra:  normal appearing urethra with no masses, tenderness or lesions              Bartholins and Skenes: normal                 Vagina: normal appearing vagina with normal color and discharge, no lesions              Cervix: no lesions              Pap taken: No. Bimanual Exam:  Uterus:  normal size, contour, position, consistency, mobility, non-tender              Adnexa: normal adnexa and no mass, fullness, tenderness               Rectovaginal: Confirms               Anus:  normal sphincter tone, no lesions  Chaperone was present for exam.  A:  Well Woman with normal exam PMP, no HRT H/o LS&A Decreased libido Hypothyroidism Parkinson's h/o migraines with aura  P:   Mammogram guidelines reviewed.  She will have MMG done in 2021.   Pap smear neg 2019.  Not indicated today. RF for estradiol cream and topical testosterone to pharmacy Lab work done with Dr. Inda Merlin.   Vaccines reviewed Colonoscopy is UTD, completed 3/18. Return annually or prn

## 2019-11-09 ENCOUNTER — Other Ambulatory Visit: Payer: Self-pay | Admitting: Neurology

## 2019-11-09 MED ORDER — AMPHETAMINE-DEXTROAMPHETAMINE 10 MG PO TABS: ORAL_TABLET | ORAL | 0 refills | Status: DC

## 2019-11-16 ENCOUNTER — Ambulatory Visit: Payer: Medicare Other | Attending: Internal Medicine

## 2019-11-16 DIAGNOSIS — Z20822 Contact with and (suspected) exposure to covid-19: Secondary | ICD-10-CM

## 2019-11-18 LAB — NOVEL CORONAVIRUS, NAA: SARS-CoV-2, NAA: NOT DETECTED

## 2019-12-17 ENCOUNTER — Ambulatory Visit: Payer: Medicare Other

## 2019-12-22 ENCOUNTER — Ambulatory Visit: Payer: Medicare Other

## 2019-12-25 ENCOUNTER — Ambulatory Visit: Payer: Medicare Other

## 2020-01-17 ENCOUNTER — Other Ambulatory Visit: Payer: Self-pay

## 2020-01-17 MED ORDER — AMPHETAMINE-DEXTROAMPHETAMINE 10 MG PO TABS: ORAL_TABLET | ORAL | 0 refills | Status: DC

## 2020-01-19 ENCOUNTER — Encounter: Payer: Self-pay | Admitting: Neurology

## 2020-01-21 ENCOUNTER — Encounter: Payer: Self-pay | Admitting: Neurology

## 2020-01-21 ENCOUNTER — Telehealth: Payer: Self-pay | Admitting: Neurology

## 2020-01-21 NOTE — Telephone Encounter (Signed)
PA was completed over the phone with [facility] representative for tier exception for her medication rasagiline. Received a denial. Appeal letter is completed and has been faxed to the [facility] appeals team. Will wait for their response.

## 2020-01-25 ENCOUNTER — Other Ambulatory Visit: Payer: Medicare Other

## 2020-01-25 ENCOUNTER — Encounter: Payer: Self-pay | Admitting: Neurology

## 2020-01-25 ENCOUNTER — Telehealth: Payer: Self-pay | Admitting: Neurology

## 2020-01-25 NOTE — Telephone Encounter (Signed)
PA submitted through cover my meds/[employer].  [id number] Will wait for response.

## 2020-01-26 ENCOUNTER — Encounter: Payer: Self-pay | Admitting: Neurology

## 2020-01-26 NOTE — Telephone Encounter (Signed)
Received a denial, sent a appeal letter to [facility] and then [facility] denied the appeal.  Advised the patient to reach out to her insurance as well and I have completed a 2nd appeal letter. Will fax the 2nd appeal for the pt today.

## 2020-01-26 NOTE — Telephone Encounter (Signed)
PA approved through [facility] for the patient for adderall. Approved until 11/18/2020

## 2020-01-27 DIAGNOSIS — L820 Inflamed seborrheic keratosis: Secondary | ICD-10-CM | POA: Diagnosis not present

## 2020-01-27 DIAGNOSIS — C44319 Basal cell carcinoma of skin of other parts of face: Secondary | ICD-10-CM | POA: Diagnosis not present

## 2020-01-27 DIAGNOSIS — Z85828 Personal history of other malignant neoplasm of skin: Secondary | ICD-10-CM | POA: Diagnosis not present

## 2020-01-27 DIAGNOSIS — L570 Actinic keratosis: Secondary | ICD-10-CM | POA: Diagnosis not present

## 2020-01-27 DIAGNOSIS — L821 Other seborrheic keratosis: Secondary | ICD-10-CM | POA: Diagnosis not present

## 2020-01-27 DIAGNOSIS — D0439 Carcinoma in situ of skin of other parts of face: Secondary | ICD-10-CM | POA: Diagnosis not present

## 2020-01-27 DIAGNOSIS — C44311 Basal cell carcinoma of skin of nose: Secondary | ICD-10-CM | POA: Diagnosis not present

## 2020-01-27 DIAGNOSIS — D1801 Hemangioma of skin and subcutaneous tissue: Secondary | ICD-10-CM | POA: Diagnosis not present

## 2020-01-27 DIAGNOSIS — D485 Neoplasm of uncertain behavior of skin: Secondary | ICD-10-CM | POA: Diagnosis not present

## 2020-02-10 NOTE — Telephone Encounter (Signed)
Received a call indicating that the 2nd appeal has "unfavorable outcome". I questioned this because the patient was approved under same insurance just last year.  I have called the number she gave me to inquire more information. [phone], opt 5 and left a message for someone to call back. The patient case # is [id number]

## 2020-02-16 DIAGNOSIS — G20 Parkinson's disease: Secondary | ICD-10-CM | POA: Diagnosis not present

## 2020-02-17 ENCOUNTER — Encounter: Payer: Self-pay | Admitting: Neurology

## 2020-03-02 DIAGNOSIS — C44311 Basal cell carcinoma of skin of nose: Secondary | ICD-10-CM | POA: Diagnosis not present

## 2020-03-02 DIAGNOSIS — C44329 Squamous cell carcinoma of skin of other parts of face: Secondary | ICD-10-CM | POA: Diagnosis not present

## 2020-03-02 DIAGNOSIS — Z85828 Personal history of other malignant neoplasm of skin: Secondary | ICD-10-CM | POA: Diagnosis not present

## 2020-03-09 DIAGNOSIS — Z85828 Personal history of other malignant neoplasm of skin: Secondary | ICD-10-CM | POA: Diagnosis not present

## 2020-03-09 DIAGNOSIS — D0439 Carcinoma in situ of skin of other parts of face: Secondary | ICD-10-CM | POA: Diagnosis not present

## 2020-03-14 ENCOUNTER — Other Ambulatory Visit: Payer: Self-pay | Admitting: Neurology

## 2020-03-14 DIAGNOSIS — G20 Parkinson's disease: Secondary | ICD-10-CM

## 2020-03-21 ENCOUNTER — Other Ambulatory Visit: Payer: Self-pay | Admitting: Neurology

## 2020-03-21 MED ORDER — RASAGILINE MESYLATE 1 MG PO TABS: 1.0000 mg | ORAL_TABLET | Freq: Every day | ORAL | 0 refills | Status: DC

## 2020-03-28 ENCOUNTER — Ambulatory Visit: Payer: Self-pay | Admitting: Adult Health

## 2020-03-29 ENCOUNTER — Other Ambulatory Visit: Payer: Self-pay | Admitting: Neurology

## 2020-03-29 MED ORDER — AMPHETAMINE-DEXTROAMPHETAMINE 10 MG PO TABS: ORAL_TABLET | ORAL | 0 refills | Status: DC

## 2020-04-14 DIAGNOSIS — E039 Hypothyroidism, unspecified: Secondary | ICD-10-CM | POA: Diagnosis not present

## 2020-04-14 DIAGNOSIS — G20 Parkinson's disease: Secondary | ICD-10-CM | POA: Diagnosis not present

## 2020-04-27 ENCOUNTER — Other Ambulatory Visit: Payer: Self-pay | Admitting: Neurology

## 2020-04-27 MED ORDER — AMPHETAMINE-DEXTROAMPHETAMINE 10 MG PO TABS: ORAL_TABLET | ORAL | 0 refills | Status: DC

## 2020-05-11 ENCOUNTER — Ambulatory Visit: Payer: Medicare Other | Admitting: Adult Health

## 2020-06-20 ENCOUNTER — Other Ambulatory Visit: Payer: Self-pay | Admitting: Neurology

## 2020-06-20 DIAGNOSIS — G20 Parkinson's disease: Secondary | ICD-10-CM

## 2020-06-29 ENCOUNTER — Other Ambulatory Visit: Payer: Self-pay | Admitting: Neurology

## 2020-07-06 DIAGNOSIS — Z85828 Personal history of other malignant neoplasm of skin: Secondary | ICD-10-CM | POA: Diagnosis not present

## 2020-07-06 DIAGNOSIS — L570 Actinic keratosis: Secondary | ICD-10-CM | POA: Diagnosis not present

## 2020-07-07 DIAGNOSIS — Z1159 Encounter for screening for other viral diseases: Secondary | ICD-10-CM | POA: Diagnosis not present

## 2020-07-08 ENCOUNTER — Other Ambulatory Visit: Payer: Self-pay | Admitting: Neurology

## 2020-07-08 MED ORDER — AMPHETAMINE-DEXTROAMPHETAMINE 10 MG PO TABS: ORAL_TABLET | ORAL | 0 refills | Status: DC

## 2020-07-13 DIAGNOSIS — E039 Hypothyroidism, unspecified: Secondary | ICD-10-CM | POA: Diagnosis not present

## 2020-07-13 DIAGNOSIS — G20 Parkinson's disease: Secondary | ICD-10-CM | POA: Diagnosis not present

## 2020-07-28 DIAGNOSIS — Z20822 Contact with and (suspected) exposure to covid-19: Secondary | ICD-10-CM | POA: Diagnosis not present

## 2020-08-11 ENCOUNTER — Encounter: Payer: Self-pay | Admitting: Adult Health

## 2020-08-11 NOTE — Telephone Encounter (Signed)
Called pt. Offered appt on 08/15/20 at 3pm with MM,NP instead. Pt accepted. I cx appt on 08/16/20 and scheduled 08/15/20 apt. Asked her to check in by 2:30pm.

## 2020-08-15 ENCOUNTER — Other Ambulatory Visit: Payer: Self-pay

## 2020-08-15 ENCOUNTER — Ambulatory Visit (INDEPENDENT_AMBULATORY_CARE_PROVIDER_SITE_OTHER): Payer: Medicare Other | Admitting: Adult Health

## 2020-08-15 ENCOUNTER — Encounter: Payer: Self-pay | Admitting: Adult Health

## 2020-08-15 ENCOUNTER — Telehealth: Payer: Self-pay | Admitting: Adult Health

## 2020-08-15 VITALS — BP 148/89 | HR 72 | Ht 64.25 in | Wt 129.4 lb

## 2020-08-15 DIAGNOSIS — G20 Parkinson's disease: Secondary | ICD-10-CM | POA: Diagnosis not present

## 2020-08-15 MED ORDER — SELEGILINE HCL 5 MG PO TABS: ORAL_TABLET | ORAL | 5 refills | Status: DC

## 2020-08-15 NOTE — Patient Instructions (Addendum)
Your Plan:  Continue Sinemet, adderall  Stop Rasagiline  Start Selegiline 5 mg in the morning and 5 mg in the afternoon If your symptoms worsen or you develop new symptoms please let Sarah Montoya know.    Thank you for coming to see Sarah Montoya at [facility]. I hope we have been able to provide you high quality care today.  You may receive a patient satisfaction survey over the next few weeks. We would appreciate your feedback and comments so that we may continue to improve ourselves and the health of our patients.  Selegiline oral dissolving tablet What is this medicine? SELEGILINE (se LE ji leen) is an monoamine oxidase inhibitor (MAOI). It is used with levodopa-carbidopa in the treatment of Parkinson's disease. It is usually added to therapy when there is a decrease in response to levodopa. This medicine may be used for other purposes; ask your health care provider or pharmacist if you have questions. COMMON BRAND NAME(S): Zelapar What should I tell my health care provider before I take this medicine? They need to know if you have any of these conditions:  high blood pressure  liver disease  low blood pressure  narcolepsy  phenylketonuria  sleep apnea  an unusual or allergic reaction to selegiline, other medicines, foods, dyes, or preservatives  pregnant or trying to get pregnant  breast-feeding How should I use this medicine? Take this medicine by mouth in the morning. Follow the directions on the prescription label. Do not push the tablet through the foil backing. Peel back the foil with dry hands, gently remove the tablet, and immediately place the tablet on your tongue. It will dissolve in seconds. Do not swallow it. Wait about 5 minutes after taking your medicine before ingesting any food or liquid. Take your doses at regular intervals. Do not take your medicine more often than directed. Do not stop taking except on the advice of your doctor or health care  professional. Talk to your pediatrician regarding the use of this medicine in children. Special care may be needed. Overdosage: If you think you have taken too much of this medicine contact a poison control center or emergency room at once. NOTE: This medicine is only for you. Do not share this medicine with others. What if I miss a dose? If you miss a dose, take it as soon as you can. If it is almost time for your next dose, take only that dose. Do not take double or extra doses. What may interact with this medicine? Do not take this medicine with any of the following medications:  cyclobenzaprine  dextromethorphan  MAOIs like Marplan, Nardil, and Parnate  meperidine  methadone  other medicines containing selegiline, like Emsam or Eldepryl  rasagiline  safinamide  stimulant medicines for attention disorders  St. John's wort  tramadol This medicine may also interact with the following medications:  antipsychotics  certain medicines for depression or anxiety  certain medicines for seizures like carbamazepine, phenobarbital, phenytoin  ephedrine  epinephrine or racepinephrine  medicines for nasal congestion like phenylephrine, pseudoephedrine  metoclopramide  nafcillin  narcotic medicines for pain  norepinephrine  rifampin  stimulant medicines for weight loss or staying awake This list may not describe all possible interactions. Give your health care provider a list of all the medicines, herbs, non-prescription drugs, or dietary supplements you use. Also tell them if you smoke, drink alcohol, or use illegal drugs. Some items may interact with your medicine. What should I watch for while using this medicine?  Visit your health care professional for regular checks on your progress. Tell your health care professional if your symptoms do not start to get better or if they get worse. Do not stop taking except on your health care professional's advice. You may develop  a severe reaction. Your health care professional will tell you how much medicine to take. This medicine can interact with certain foods that contain high amounts of tyramine. The combination may cause severe headaches, a rise in blood pressure, or irregular heartbeat. Foods that contain significant amounts of tyramine include aged cheeses, meats and fish (especially aged, smoked, pickled, or processed such as bologna, pepperoni, salami, summer sausage), beer and ale, alcohol-free beer, wine (especially red), sherry, hard liquor, liqueurs, avocados, bananas, figs, raisins, soy sauce, miso soup, yeast/protein extracts, bean curd, fava or broad bean pods, or any over-ripe fruit. Ask your doctor or health care professional, pharmacist, or nutritionist for a complete listing of tyramine-containing foods. You may get drowsy or dizzy. Do not drive, use machinery, or do anything that needs mental alertness until you know how this medicine affects you. Do not stand or sit up quickly, especially if you are an older patient. This reduces the risk of dizzy or fainting spells. Alcohol can interfere with the effect of this medicine. Avoid alcoholic drinks. When taking this medicine, you may fall asleep without notice. You may be doing activities like driving a car, talking, or eating. You may not feel drowsy before it happens. Contact your health care provider right away if this happens to you. There have been reports of increased sexual urges or other strong urges such as gambling while taking this medicine. If you experience any of these while taking this medicine, you should report this to your health care provider as soon as possible. Your mouth may get dry. Chewing sugarless gum or sucking hard candy and drinking plenty of water may help. Contact your health care professional if the problem does not go away or is severe. Do not treat yourself for coughs, colds, flu or allergies without asking your doctor or health care  professional for advice. Do not take any medications for weight loss without advice either. Some ingredients in these products may increase possible side effects. If you are diabetic there is a possibility that this medicine may affect your blood sugar. Ask your doctor or health care professional for advice if there is any change in your blood or urine sugar tests. If you are scheduled for any medical or dental procedure, tell your healthcare provider that you are taking this medicine. This medicine can interact with other medicines used during surgery. You should check your skin often for changes to moles and new growths while taking this medicine. Call your doctor if you notice any of these changes. What side effects may I notice from receiving this medicine? Side effects that you should report to your doctor or health care professional as soon as possible:  allergic reactions like skin rash, itching or hives, swelling of the face, lips, or tongue  breathing problems  changes in emotions or moods  chest pain  confusion  falling asleep during normal activities like driving  high blood pressure  new or increased gambling urges, sexual urges, uncontrollable spending, binge or compulsive eating, or other urges  seizures  suicidal thoughts  signs and symptoms of low blood pressure like dizziness; feeling faint or lightheaded, falls; unusually weak or tired  signs and symptoms of hypertensive crisis like severe sudden headache; excess  sweating, sometimes with a fever or cold and clammy skin; eyes sensitive to light; fast, irregular heartbeat  uncontrollable movements of the arms, face, head, mouth, neck, and upper body Side effects that usually do not require medical attention (report to your doctor or health care professional if they continue or are bothersome):  constipation  dizziness  dry mouth  headache  muscle pain  nausea  trouble sleeping This list may not describe  all possible side effects. Call your doctor for medical advice about side effects. You may report side effects to FDA at 1-800-FDA-1088. Where should I keep my medicine? Keep out of the reach of children. Store at room temperature between 15 and 30 degrees C (59 and 86 degrees F). Use within 3 months of opening the pouch and immediately after opening individual blister. Throw away any unused medicine after the expiration date. NOTE: This sheet is a summary. It may not cover all possible information. If you have questions about this medicine, talk to your doctor, pharmacist, or health care provider.  2020 Elsevier/Gold Standard (2019-07-13 20:21:35)

## 2020-08-15 NOTE — Telephone Encounter (Signed)
Called [facility] but was on hold for over 10 minutes. Will call back tomorrow.

## 2020-08-15 NOTE — Telephone Encounter (Signed)
[place] [address] - is asking for a call from RN with concern about selegiline (ELDEPRYL) 5 MG tablet and its interaction with pt's other medications, please call.

## 2020-08-15 NOTE — Progress Notes (Signed)
PATIENT: Jonika Critz DOB: 1951-04-15  REASON FOR VISIT: follow up HISTORY FROM: patient  HISTORY OF PRESENT ILLNESS: Today 08/15/20:  Ms. Olthoff is a 69 year old female with a history of Parkinson's disease.  She returns today for follow-up.  She did see Dr. Deboraha Sprang in March of this year.  At that time she was doing well.  She feels that her symptoms have remained relatively stable.  Denies a significant tremor.  Denies any significant changes with her gait or balance.  Denies any trouble chewing or swallowing food.  She does report that rasagiline is quite expensive.  According to Dr. Liane Comber note he was okay with switching her to selegiline 5 mg twice a day.  She continues on Sinemet 1 tablet 3 times a day.  She also uses Adderall reports that she typically takes 5 mg occasionally in the afternoons as that is when her fatigue is the worst.  She does try to take a 20-minute nap daily.  Reports that she tries to stay well-hydrated.   REVIEW OF SYSTEMS: Out of a complete 14 system review of symptoms, the patient complains only of the following symptoms, and all other reviewed systems are negative.  See HPI  ALLERGIES: Allergies  Allergen Reactions  . Ciprofloxacin Diarrhea    Upset stomach  . Codeine Nausea Only    HOME MEDICATIONS: Outpatient Medications Prior to Visit  Medication Sig Dispense Refill  . amphetamine-dextroamphetamine (ADDERALL) 10 MG tablet Take 1 tablet daily by mouth before breakfast. 30 tablet 0  . carbidopa-levodopa (SINEMET IR) 25-100 MG tablet TAKE 1 TABLET BY MOUTH THREE TIMES DAILY 270 tablet 0  . Cholecalciferol (D3 HIGH POTENCY) 50 MCG (2000 UT) CAPS     . clobetasol ointment (TEMOVATE) 0.05 % APPLY TOPICALLY TWICE DAILY FOR NO MORE THAN 7 DAYS 45 g 1  . Cyanocobalamin (B-12) 1000 MCG CAPS     . Lactase (LACTAID PO) Take by mouth as needed.    Marland Kitchen levothyroxine (EUTHYROX) 88 MCG tablet Take 88 mcg by mouth daily before breakfast.    . NONFORMULARY  OR COMPOUNDED ITEM Estradiol 0.02% in 64ml prefilled syringes.  One syringe pv two to three times weekly. 36 each 2  . NONFORMULARY OR COMPOUNDED ITEM Topical testosterone cream 1mg / 0.59ml.  Apply 0.9ml topically two times weekly. #15month supply/1RF 1 each 1  . rasagiline (AZILECT) 1 MG TABS tablet Take 1 tablet by mouth once daily 90 tablet 0  . SUMAtriptan (IMITREX) 100 MG tablet 100 mg as needed.    . vitamin C (ASCORBIC ACID) 500 MG tablet Take by mouth.    . Calcium-Vitamin D-Vitamin K (VIACTIV PO) Take 2 tablets by mouth.    . Multiple Vitamin (MULTIVITAMIN) tablet Take 1 tablet by mouth daily.     No facility-administered medications prior to visit.    PAST MEDICAL HISTORY: Past Medical History:  Diagnosis Date  . Basal cell carcinoma   . Drowsiness    excessive daytime  . Hx of migraines   . Hypersomnia 08/26/2013  . Hypothyroidism   . Hypothyroidism 10/21/2014  . Narcolepsy   . Parkinson disease ([place])     PAST SURGICAL HISTORY: Past Surgical History:  Procedure Laterality Date  . MENISCUS REPAIR Right 05/2014    FAMILY HISTORY: Family History  Problem Relation Age of Onset  . Breast cancer Mother 59  . Osteoporosis Mother   . Prostate cancer Father   . Thyroid disease Father   . Mental retardation Sister  SOCIAL HISTORY: Social History   Socioeconomic History  . Marital status: Married    Spouse name: Not on file  . Number of children: 3  . Years of education: college  . Highest education level: Not on file  Occupational History  . Occupation: Art therapist    Comment: [employer]  Tobacco Use  . Smoking status: Never Smoker  . Smokeless tobacco: Never Used  Vaping Use  . Vaping Use: Never used  Substance and Sexual Activity  . Alcohol use: Not Currently    Alcohol/week: 0.0 standard drinks  . Drug use: No  . Sexual activity: Yes    Partners: Male    Birth control/protection: Post-menopausal  Other Topics Concern  . Not on file  Social  History Narrative   See previous notes.   Social Determinants of Health   Financial Resource Strain:   . Difficulty of Paying Living Expenses: Not on file  Food Insecurity:   . Worried About Charity fundraiser in the Last Year: Not on file  . Ran Out of Food in the Last Year: Not on file  Transportation Needs:   . Lack of Transportation (Medical): Not on file  . Lack of Transportation (Non-Medical): Not on file  Physical Activity:   . Days of Exercise per Week: Not on file  . Minutes of Exercise per Session: Not on file  Stress:   . Feeling of Stress : Not on file  Social Connections:   . Frequency of Communication with Friends and Family: Not on file  . Frequency of Social Gatherings with Friends and Family: Not on file  . Attends Religious Services: Not on file  . Active Member of Clubs or Organizations: Not on file  . Attends Archivist Meetings: Not on file  . Marital Status: Not on file  Intimate Partner Violence:   . Fear of Current or Ex-Partner: Not on file  . Emotionally Abused: Not on file  . Physically Abused: Not on file  . Sexually Abused: Not on file      PHYSICAL EXAM  Vitals:   08/15/20 1455  BP: (!) 148/89  Pulse: 72  Weight: 129 lb 6.4 oz (58.7 kg)  Height: 5' 4.25" (1.632 m)   Body mass index is 22.04 kg/m.  Generalized: Well developed, in no acute distress   Neurological examination  Mentation: Alert oriented to time, place, history taking. Follows all commands speech and language fluent Cranial nerve II-XII: Pupils were equal round reactive to light. Extraocular movements were full, visual field were full on confrontational test. Facial sensation and strength were normal. Uvula tongue midline. Head turning and shoulder shrug  were normal and symmetric. Motor: The motor testing reveals 5 over 5 strength of all 4 extremities.  Mild impairment of finger taps bilaterally.  Sensory: Sensory testing is intact to soft touch on all 4  extremities. No evidence of extinction is noted.  Coordination: Cerebellar testing reveals good finger-nose-finger and heel-to-shin bilaterally.  Gait and station: Patient able to stand without assistance.  Good stride.  Accentuates arm swing bilaterally Reflexes: Deep tendon reflexes are symmetric and normal bilaterally.   DIAGNOSTIC DATA (LABS, IMAGING, TESTING) - I reviewed patient records, labs, notes, testing and imaging myself where available.  Lab Results  Component Value Date   HGB 13.9 09/21/2014      Component Value Date/Time   NA 139 09/21/2014 1554   K 4.2 09/21/2014 1554   CL 103 09/21/2014 1554   CO2 26 09/21/2014 1554  GLUCOSE 98 09/21/2014 1554   BUN 15 09/21/2014 1554   CREATININE 0.71 09/21/2014 1554   CALCIUM 9.6 09/21/2014 1554   PROT 7.0 09/21/2014 1554   ALBUMIN 4.5 09/21/2014 1554   AST 22 09/21/2014 1554   ALT 20 09/21/2014 1554   ALKPHOS 58 09/21/2014 1554   BILITOT 0.4 09/21/2014 1554   Lab Results  Component Value Date   CHOL 191 09/21/2014   HDL 71 09/21/2014   LDLCALC 98 09/21/2014   TRIG 108 09/21/2014   CHOLHDL 2.7 09/21/2014   No results found for: HGBA1C No results found for: VITAMINB12 Lab Results  Component Value Date   TSH 1.603 06/30/2013      ASSESSMENT AND PLAN 69 y.o. year old female  has a past medical history of Basal cell carcinoma, Drowsiness, migraines, Hypersomnia (08/26/2013), Hypothyroidism, Hypothyroidism (10/21/2014), Narcolepsy, and Parkinson disease ([place]). here with:  Parkinson's disease   Continue Sinemet 25-100 mg 3 times a day  Continue Adderall 5 mg daily if needed  Stop rasagiline  Start selegiline 5 mg in the morning and afternoon-information provided about this medication  Advised to follow-up in 1 year or sooner if needed   I spent 30 minutes of face-to-face and non-face-to-face time with patient.  This included previsit chart review, lab review, study review, order entry, electronic health  record documentation, patient education.  Ward Givens, MSN, NP-C 08/15/2020, 3:18 PM [facility] [address], [place], [place] [address] [phone]

## 2020-08-16 ENCOUNTER — Ambulatory Visit: Payer: Medicare Other | Admitting: Adult Health

## 2020-08-16 NOTE — Telephone Encounter (Signed)
[place] [address] - is asking for a call from RN with concern about selegiline (ELDEPRYL) 5 MG tablet and its interaction with pt's other medications, please call.

## 2020-08-17 ENCOUNTER — Other Ambulatory Visit: Payer: Self-pay | Admitting: *Deleted

## 2020-08-17 NOTE — Telephone Encounter (Signed)
Can you ask patient how often she uses imitrex

## 2020-08-17 NOTE — Telephone Encounter (Signed)
Medication refill request: Testosterone  Last AEX:  11-03-2019 SM  Next AEX: 01-20-21  Last MMG (if hormonal medication request): 07-05-18 density B/BIRADS 1 negative  Refill authorized: Today, please advise.    Medication pended to print for #1,1RF to be faxed to [facility]. Fax #: [phone]. Please refill if appropriate.

## 2020-08-17 NOTE — Telephone Encounter (Signed)
Spoke with pharmacist. They are concerned about serotonin syndrome effect between the selegiline 5mg  and sumatriptan 100mg . They want to know if the provider is still ok with the patient receiving the selegiline.   Megan, please advise. Thanks!

## 2020-08-18 DIAGNOSIS — Z23 Encounter for immunization: Secondary | ICD-10-CM | POA: Diagnosis not present

## 2020-08-18 NOTE — Telephone Encounter (Signed)
Left detailed message for pt, NP would like to know how often pt uses imitrex, times weekly/monthly  Need this information then we will call pharmacy back

## 2020-08-19 MED ORDER — NONFORMULARY OR COMPOUNDED ITEM
1 refills | Status: DC
Start: 2020-08-19 — End: 2021-05-15

## 2020-08-19 NOTE — Telephone Encounter (Signed)
Prescription for testosterone faxed to [facility]. Fax #: [phone].   Encounter closed.

## 2020-08-24 NOTE — Telephone Encounter (Signed)
Attempted to call pt, LVM for call back  °

## 2020-08-31 ENCOUNTER — Other Ambulatory Visit: Payer: Self-pay | Admitting: Neurology

## 2020-09-01 MED ORDER — AMPHETAMINE-DEXTROAMPHETAMINE 10 MG PO TABS
ORAL_TABLET | ORAL | 0 refills | Status: DC
Start: 2020-09-01 — End: 2020-10-18

## 2020-09-05 DIAGNOSIS — I7300 Raynaud's syndrome without gangrene: Secondary | ICD-10-CM | POA: Diagnosis not present

## 2020-09-05 DIAGNOSIS — G20 Parkinson's disease: Secondary | ICD-10-CM | POA: Diagnosis not present

## 2020-09-05 DIAGNOSIS — E538 Deficiency of other specified B group vitamins: Secondary | ICD-10-CM | POA: Diagnosis not present

## 2020-09-05 DIAGNOSIS — Z0000 Encounter for general adult medical examination without abnormal findings: Secondary | ICD-10-CM | POA: Diagnosis not present

## 2020-09-05 DIAGNOSIS — E559 Vitamin D deficiency, unspecified: Secondary | ICD-10-CM | POA: Diagnosis not present

## 2020-09-05 DIAGNOSIS — E039 Hypothyroidism, unspecified: Secondary | ICD-10-CM | POA: Diagnosis not present

## 2020-09-05 DIAGNOSIS — Z79899 Other long term (current) drug therapy: Secondary | ICD-10-CM | POA: Diagnosis not present

## 2020-09-05 DIAGNOSIS — G43909 Migraine, unspecified, not intractable, without status migrainosus: Secondary | ICD-10-CM | POA: Diagnosis not present

## 2020-09-05 DIAGNOSIS — Z1239 Encounter for other screening for malignant neoplasm of breast: Secondary | ICD-10-CM | POA: Diagnosis not present

## 2020-09-05 DIAGNOSIS — Z8639 Personal history of other endocrine, nutritional and metabolic disease: Secondary | ICD-10-CM | POA: Diagnosis not present

## 2020-09-05 DIAGNOSIS — Z1211 Encounter for screening for malignant neoplasm of colon: Secondary | ICD-10-CM | POA: Diagnosis not present

## 2020-09-05 DIAGNOSIS — Z23 Encounter for immunization: Secondary | ICD-10-CM | POA: Diagnosis not present

## 2020-09-05 DIAGNOSIS — Z1389 Encounter for screening for other disorder: Secondary | ICD-10-CM | POA: Diagnosis not present

## 2020-09-12 DIAGNOSIS — Z23 Encounter for immunization: Secondary | ICD-10-CM | POA: Diagnosis not present

## 2020-09-14 ENCOUNTER — Other Ambulatory Visit: Payer: Self-pay | Admitting: Neurology

## 2020-09-14 DIAGNOSIS — G20 Parkinson's disease: Secondary | ICD-10-CM

## 2020-09-25 ENCOUNTER — Encounter: Payer: Self-pay | Admitting: Adult Health

## 2020-09-26 MED ORDER — RASAGILINE MESYLATE 1 MG PO TABS: 1.0000 mg | ORAL_TABLET | Freq: Every day | ORAL | 11 refills | Status: DC

## 2020-09-30 ENCOUNTER — Other Ambulatory Visit: Payer: Self-pay | Admitting: Obstetrics & Gynecology

## 2020-09-30 DIAGNOSIS — Z1231 Encounter for screening mammogram for malignant neoplasm of breast: Secondary | ICD-10-CM

## 2020-10-18 ENCOUNTER — Other Ambulatory Visit: Payer: Self-pay | Admitting: Neurology

## 2020-10-18 MED ORDER — AMPHETAMINE-DEXTROAMPHETAMINE 10 MG PO TABS: ORAL_TABLET | ORAL | 0 refills | Status: DC

## 2020-11-06 DIAGNOSIS — E039 Hypothyroidism, unspecified: Secondary | ICD-10-CM | POA: Diagnosis not present

## 2020-11-06 DIAGNOSIS — G20 Parkinson's disease: Secondary | ICD-10-CM | POA: Diagnosis not present

## 2020-11-22 ENCOUNTER — Ambulatory Visit: Payer: Medicare Other

## 2020-12-20 ENCOUNTER — Other Ambulatory Visit: Payer: Self-pay | Admitting: Adult Health

## 2020-12-20 DIAGNOSIS — G20 Parkinson's disease: Secondary | ICD-10-CM

## 2020-12-28 ENCOUNTER — Ambulatory Visit: Payer: Medicare Other

## 2020-12-29 ENCOUNTER — Other Ambulatory Visit: Payer: Self-pay | Admitting: Obstetrics & Gynecology

## 2020-12-29 MED ORDER — CLOBETASOL PROPIONATE 0.05 % EX OINT
TOPICAL_OINTMENT | CUTANEOUS | 0 refills | Status: DC
Start: 2020-12-29 — End: 2024-07-10

## 2021-01-06 DIAGNOSIS — G20 Parkinson's disease: Secondary | ICD-10-CM | POA: Diagnosis not present

## 2021-01-06 DIAGNOSIS — E039 Hypothyroidism, unspecified: Secondary | ICD-10-CM | POA: Diagnosis not present

## 2021-01-20 ENCOUNTER — Ambulatory Visit: Payer: Medicare Other

## 2021-01-25 ENCOUNTER — Ambulatory Visit
Admission: RE | Admit: 2021-01-25 | Discharge: 2021-01-25 | Disposition: A | Discharge status: Home / Self Care | Payer: Medicare Other | Source: Ambulatory Visit | Attending: Obstetrics & Gynecology | Admitting: Obstetrics & Gynecology

## 2021-01-25 ENCOUNTER — Other Ambulatory Visit: Payer: Self-pay

## 2021-01-25 DIAGNOSIS — Z1231 Encounter for screening mammogram for malignant neoplasm of breast: Secondary | ICD-10-CM

## 2021-02-01 DIAGNOSIS — L821 Other seborrheic keratosis: Secondary | ICD-10-CM | POA: Diagnosis not present

## 2021-02-01 DIAGNOSIS — Z85828 Personal history of other malignant neoplasm of skin: Secondary | ICD-10-CM | POA: Diagnosis not present

## 2021-02-01 DIAGNOSIS — L218 Other seborrheic dermatitis: Secondary | ICD-10-CM | POA: Diagnosis not present

## 2021-02-01 DIAGNOSIS — L989 Disorder of the skin and subcutaneous tissue, unspecified: Secondary | ICD-10-CM | POA: Diagnosis not present

## 2021-02-01 DIAGNOSIS — L814 Other melanin hyperpigmentation: Secondary | ICD-10-CM | POA: Diagnosis not present

## 2021-02-01 DIAGNOSIS — L308 Other specified dermatitis: Secondary | ICD-10-CM | POA: Diagnosis not present

## 2021-02-01 DIAGNOSIS — C44712 Basal cell carcinoma of skin of right lower limb, including hip: Secondary | ICD-10-CM | POA: Diagnosis not present

## 2021-02-01 DIAGNOSIS — L570 Actinic keratosis: Secondary | ICD-10-CM | POA: Diagnosis not present

## 2021-02-01 DIAGNOSIS — D1801 Hemangioma of skin and subcutaneous tissue: Secondary | ICD-10-CM | POA: Diagnosis not present

## 2021-02-01 DIAGNOSIS — C44719 Basal cell carcinoma of skin of left lower limb, including hip: Secondary | ICD-10-CM | POA: Diagnosis not present

## 2021-02-01 DIAGNOSIS — D485 Neoplasm of uncertain behavior of skin: Secondary | ICD-10-CM | POA: Diagnosis not present

## 2021-02-13 ENCOUNTER — Ambulatory Visit (HOSPITAL_BASED_OUTPATIENT_CLINIC_OR_DEPARTMENT_OTHER): Payer: Medicare Other | Admitting: Obstetrics & Gynecology

## 2021-02-22 ENCOUNTER — Encounter (HOSPITAL_BASED_OUTPATIENT_CLINIC_OR_DEPARTMENT_OTHER): Payer: Self-pay

## 2021-02-22 ENCOUNTER — Other Ambulatory Visit (HOSPITAL_BASED_OUTPATIENT_CLINIC_OR_DEPARTMENT_OTHER): Payer: Self-pay | Admitting: Obstetrics & Gynecology

## 2021-02-22 MED ORDER — NONFORMULARY OR COMPOUNDED ITEM: 3 refills | Status: DC

## 2021-02-27 DIAGNOSIS — Z85828 Personal history of other malignant neoplasm of skin: Secondary | ICD-10-CM | POA: Diagnosis not present

## 2021-02-27 DIAGNOSIS — C44719 Basal cell carcinoma of skin of left lower limb, including hip: Secondary | ICD-10-CM | POA: Diagnosis not present

## 2021-02-27 DIAGNOSIS — U071 COVID-19: Secondary | ICD-10-CM | POA: Diagnosis not present

## 2021-03-02 DIAGNOSIS — E039 Hypothyroidism, unspecified: Secondary | ICD-10-CM | POA: Diagnosis not present

## 2021-03-02 DIAGNOSIS — G20 Parkinson's disease: Secondary | ICD-10-CM | POA: Diagnosis not present

## 2021-03-07 DIAGNOSIS — Z23 Encounter for immunization: Secondary | ICD-10-CM | POA: Diagnosis not present

## 2021-03-27 DIAGNOSIS — L821 Other seborrheic keratosis: Secondary | ICD-10-CM | POA: Diagnosis not present

## 2021-03-27 DIAGNOSIS — D485 Neoplasm of uncertain behavior of skin: Secondary | ICD-10-CM | POA: Diagnosis not present

## 2021-03-27 DIAGNOSIS — Z85828 Personal history of other malignant neoplasm of skin: Secondary | ICD-10-CM | POA: Diagnosis not present

## 2021-04-03 DIAGNOSIS — G20 Parkinson's disease: Secondary | ICD-10-CM | POA: Diagnosis not present

## 2021-04-03 DIAGNOSIS — E039 Hypothyroidism, unspecified: Secondary | ICD-10-CM | POA: Diagnosis not present

## 2021-04-11 DIAGNOSIS — C44319 Basal cell carcinoma of skin of other parts of face: Secondary | ICD-10-CM | POA: Diagnosis not present

## 2021-04-11 DIAGNOSIS — D485 Neoplasm of uncertain behavior of skin: Secondary | ICD-10-CM | POA: Diagnosis not present

## 2021-04-18 ENCOUNTER — Ambulatory Visit (HOSPITAL_BASED_OUTPATIENT_CLINIC_OR_DEPARTMENT_OTHER): Payer: Medicare Other | Admitting: Obstetrics & Gynecology

## 2021-04-19 DIAGNOSIS — G20 Parkinson's disease: Secondary | ICD-10-CM | POA: Diagnosis not present

## 2021-04-19 DIAGNOSIS — Z79899 Other long term (current) drug therapy: Secondary | ICD-10-CM | POA: Diagnosis not present

## 2021-04-27 ENCOUNTER — Ambulatory Visit (HOSPITAL_BASED_OUTPATIENT_CLINIC_OR_DEPARTMENT_OTHER): Payer: Medicare Other | Admitting: Obstetrics & Gynecology

## 2021-05-15 ENCOUNTER — Other Ambulatory Visit (HOSPITAL_BASED_OUTPATIENT_CLINIC_OR_DEPARTMENT_OTHER): Payer: Self-pay | Admitting: Obstetrics & Gynecology

## 2021-05-15 MED ORDER — NONFORMULARY OR COMPOUNDED ITEM: 1 refills | Status: DC

## 2021-06-12 ENCOUNTER — Encounter (HOSPITAL_BASED_OUTPATIENT_CLINIC_OR_DEPARTMENT_OTHER): Payer: Self-pay | Admitting: Obstetrics & Gynecology

## 2021-06-12 ENCOUNTER — Ambulatory Visit (INDEPENDENT_AMBULATORY_CARE_PROVIDER_SITE_OTHER): Payer: Medicare Other | Admitting: Obstetrics & Gynecology

## 2021-06-12 ENCOUNTER — Other Ambulatory Visit (HOSPITAL_COMMUNITY)
Admission: RE | Admit: 2021-06-12 | Discharge: 2021-06-12 | Disposition: A | Discharge status: Home / Self Care | Payer: Medicare Other | Source: Ambulatory Visit | Attending: Obstetrics & Gynecology | Admitting: Obstetrics & Gynecology

## 2021-06-12 ENCOUNTER — Other Ambulatory Visit: Payer: Self-pay

## 2021-06-12 VITALS — BP 137/92 | HR 86 | Ht 64.0 in | Wt 129.8 lb

## 2021-06-12 DIAGNOSIS — Z01419 Encounter for gynecological examination (general) (routine) without abnormal findings: Secondary | ICD-10-CM | POA: Diagnosis not present

## 2021-06-12 DIAGNOSIS — L900 Lichen sclerosus et atrophicus: Secondary | ICD-10-CM

## 2021-06-12 DIAGNOSIS — R6882 Decreased libido: Secondary | ICD-10-CM | POA: Diagnosis not present

## 2021-06-12 DIAGNOSIS — M8580 Other specified disorders of bone density and structure, unspecified site: Secondary | ICD-10-CM | POA: Diagnosis not present

## 2021-06-12 DIAGNOSIS — M8588 Other specified disorders of bone density and structure, other site: Secondary | ICD-10-CM

## 2021-06-12 DIAGNOSIS — Z124 Encounter for screening for malignant neoplasm of cervix: Secondary | ICD-10-CM | POA: Diagnosis not present

## 2021-06-12 DIAGNOSIS — N952 Postmenopausal atrophic vaginitis: Secondary | ICD-10-CM | POA: Diagnosis not present

## 2021-06-12 NOTE — Progress Notes (Signed)
70 y.o. G3P3 Married White or Caucasian female here for breast and pelvic exam.  Doing well.  Has seen Dr. Linus Mako at [facility] for her Parkinson's.  On sinemet 3 times daily.    Denies vaginal bleeding.  Using the vaginal estrogen cream twice weekly.  She is also on topical testosterone twice weekly.  Does not need RFs at this time.  Patient's last menstrual period was 07/03/2008.          Sexually active: Yes.    H/O STD:  no  Health Maintenance: PCP:  Dr. Josetta Huddle.  Last wellness appt was 08/2020.  Did blood work at that appt:  yes Vaccines are up to date:  yes but pt aware I do not have tdap date Colonoscopy:  02/14/2017, negative, follow up not recommended MMG:  01/25/2021 Negative BMD:  06/25/2018 Last pap smear:  07/18/2018 Negative H/o abnormal pap smear:  no   reports that she has never smoked. She has never used smokeless tobacco. She reports previous alcohol use. She reports that she does not use drugs.  Past Medical History:  Diagnosis Date   Basal cell carcinoma    Drowsiness    excessive daytime   Hx of migraines    Hypersomnia 08/26/2013   Hypothyroidism    Hypothyroidism 10/21/2014   Narcolepsy    Parkinson disease ([place])     Past Surgical History:  Procedure Laterality Date   MENISCUS REPAIR Right 05/2014    Current Outpatient Medications  Medication Sig Dispense Refill   carbidopa-levodopa (SINEMET IR) 25-100 MG tablet TAKE 1 TABLET BY MOUTH THREE TIMES DAILY 270 tablet 2   Cholecalciferol (D3 HIGH POTENCY) 50 MCG (2000 UT) CAPS      clobetasol ointment (TEMOVATE) 0.05 % Apply bid for up to 7 days for itching 45 g 0   Cyanocobalamin (B-12) 1000 MCG CAPS      Lactase (LACTAID PO) Take by mouth as needed.     levothyroxine (SYNTHROID) 88 MCG tablet Take 88 mcg by mouth daily before breakfast.     NONFORMULARY OR COMPOUNDED ITEM Estradiol 0.02% in 34m prefilled syringes.  One syringe pv two to three times weekly. 36 each 3   NONFORMULARY OR COMPOUNDED ITEM  Topical testosterone cream '1mg'$ / 0.13m  Apply 0.38m24mopically two times weekly. #105mo67monthply/1RF 1 each 1   rasagiline (AZILECT) 1 MG TABS tablet Take 1 tablet (1 mg total) by mouth daily. 30 tablet 11   SUMAtriptan (IMITREX) 100 MG tablet 100 mg as needed.     vitamin C (ASCORBIC ACID) 500 MG tablet Take by mouth.     No current facility-administered medications for this visit.    Family History  Problem Relation Age of Onset   Breast cancer Mother 54  36steoporosis Mother    Prostate cancer Father    Thyroid disease Father    Mental retardation Sister     Review of Systems  Constitutional: Negative.   Gastrointestinal: Negative.   Genitourinary: Negative.        Mild urinary incontinence   Exam:   BP (!) 137/92 (BP Location: Right Arm, Patient Position: Sitting, Cuff Size: Small)   Pulse 86   Ht '5\' 4"'$  (1.626 m)   Wt 129 lb 12.8 oz (58.9 kg)   LMP 07/03/2008   BMI 22.28 kg/m   Height: '5\' 4"'$  (162.6 cm)  General appearance: alert, cooperative and appears stated age Breasts: normal appearance, no masses or tenderness Abdomen: soft, non-tender; bowel sounds normal; no masses,  no organomegaly Lymph nodes: Cervical, supraclavicular, and axillary nodes normal.  No abnormal inguinal nodes palpated Neurologic: Grossly normal  Pelvic: External genitalia: hypopigmentation and tissue thickening especially below vagina on pt's right              Urethra:  normal appearing urethra with no masses, tenderness or lesions              Bartholins and Skenes: normal                 Vagina: normal appearing vagina with atrophic changes and no discharge, no lesions              Cervix: no lesions              Pap taken: Yes.   Bimanual Exam:  Uterus:  normal size, contour, position, consistency, mobility, non-tender              Adnexa: normal adnexa and no mass, fullness, tenderness               Rectovaginal: Confirms               Anus:  normal sphincter tone, no  lesions  Chaperone, Leda Min, CMA, was present for exam.  Assessment/Plan: 1. Encntr for gyn exam (general) (routine) w/o abn findings - pap smear obtained today - MMG 01/2021 - colonoscopy 01/2017.  Follow up not recommended for screening purposes - BMD 06/2018 - vaccines reviewed - lab work done with Dr. Inda Merlin  2. Osteopenia, unspecified location - DG BONE DENSITY (DXA); Future  3. Other specified disorders of bone density and structure, other site   4. Lichen sclerosus - skin changes present today and worse than prior exam.  Pt will start back topical clobetasol (does not need rx) and recheck in 1 month.  May need vulvar biopsy at that time.  5. Decreased libido - using topical testosterone.  Last blood level was low.  Consider repeating next year  6. Vaginal atrophy - uses compounded estradiol cream.  Does not need RF at this time.  Will hold while using topical steroid

## 2021-06-14 LAB — CYTOLOGY - PAP: Diagnosis: NEGATIVE

## 2021-06-15 DIAGNOSIS — C44319 Basal cell carcinoma of skin of other parts of face: Secondary | ICD-10-CM | POA: Diagnosis not present

## 2021-06-15 DIAGNOSIS — Z85828 Personal history of other malignant neoplasm of skin: Secondary | ICD-10-CM | POA: Diagnosis not present

## 2021-07-13 ENCOUNTER — Encounter (HOSPITAL_BASED_OUTPATIENT_CLINIC_OR_DEPARTMENT_OTHER): Payer: Self-pay | Admitting: Obstetrics & Gynecology

## 2021-07-13 ENCOUNTER — Ambulatory Visit (INDEPENDENT_AMBULATORY_CARE_PROVIDER_SITE_OTHER): Payer: Medicare Other | Admitting: Obstetrics & Gynecology

## 2021-07-13 ENCOUNTER — Other Ambulatory Visit: Payer: Self-pay

## 2021-07-13 VITALS — BP 132/101 | HR 82 | Ht 65.0 in | Wt 132.6 lb

## 2021-07-13 DIAGNOSIS — L900 Lichen sclerosus et atrophicus: Secondary | ICD-10-CM

## 2021-07-13 DIAGNOSIS — N393 Stress incontinence (female) (male): Secondary | ICD-10-CM

## 2021-07-13 MED ORDER — MOMETASONE FUROATE 0.1 % EX OINT: TOPICAL_OINTMENT | CUTANEOUS | 1 refills | Status: DC

## 2021-07-13 NOTE — Progress Notes (Signed)
GYNECOLOGY  VISIT  CC:   vulvar recheck  HPI: 70 y.o. G3P3 Married White or Caucasian female here for recheck of vulva lichen sclerosus.  Has been using clobetasol at least daily, sometimes twice daily.  Does not have an itching.  Denies bleeding.  Denies irritation.    Reports she is interested in seeing PT now.  We discussed this at last visit as she reported urinary leakage.  Would like referral not.  Leaking is primarily with exercise or when bladder is really full.  Does have urgency with leaking when bladder is full.  Not interested in medication or surgery.  Patient Active Problem List   Diagnosis Date Noted   Medication monitoring encounter 04/01/2018   Parkinson's disease ([place]) 04/01/2018   Muscle tone increased 06/12/2017   Bradykinesia 06/12/2017   Dysphonia 06/12/2017   Raynaud's phenomenon without gangrene 06/12/2017   Tarsal tunnel syndrome 02/21/2017   Hypothyroidism 10/21/2014   Hypersomnia, persistent 10/21/2014   Injury of meniscus of right knee 07/22/2014   Subacromial bursitis 02/04/2013   Abnormality of gait 08/13/2012   Enthesopathy of ankle and tarsus 10/04/2009    Past Medical History:  Diagnosis Date   Basal cell carcinoma    Drowsiness    excessive daytime   Hx of migraines    Hypersomnia 08/26/2013   Hypothyroidism    Hypothyroidism 10/21/2014   Narcolepsy    Parkinson disease ([place])     Past Surgical History:  Procedure Laterality Date   MENISCUS REPAIR Right 05/2014    MEDS:   Current Outpatient Medications on File Prior to Visit  Medication Sig Dispense Refill   carbidopa-levodopa (SINEMET IR) 25-100 MG tablet TAKE 1 TABLET BY MOUTH THREE TIMES DAILY 270 tablet 2   Cholecalciferol (D3 HIGH POTENCY) 50 MCG (2000 UT) CAPS      clobetasol ointment (TEMOVATE) 0.05 % Apply bid for up to 7 days for itching 45 g 0   Cyanocobalamin (B-12) 1000 MCG CAPS      Lactase (LACTAID PO) Take by mouth as needed.     levothyroxine (SYNTHROID) 88 MCG tablet  Take 88 mcg by mouth daily before breakfast.     NONFORMULARY OR COMPOUNDED ITEM Estradiol 0.02% in 50m prefilled syringes.  One syringe pv two to three times weekly. 36 each 3   NONFORMULARY OR COMPOUNDED ITEM Topical testosterone cream '1mg'$ / 0.19m  Apply 0.38m41mopically two times weekly. #1mo69monthply/1RF 1 each 1   rasagiline (AZILECT) 1 MG TABS tablet Take 1 tablet (1 mg total) by mouth daily. 30 tablet 11   SUMAtriptan (IMITREX) 100 MG tablet 100 mg as needed.     vitamin C (ASCORBIC ACID) 500 MG tablet Take by mouth.     No current facility-administered medications on file prior to visit.    ALLERGIES: Ciprofloxacin and Codeine  Family History  Problem Relation Age of Onset   Breast cancer Mother 54  20steoporosis Mother    Prostate cancer Father    Thyroid disease Father    Mental retardation Sister     SH:  married, non smoker  Review of Systems  Constitutional: Negative.   Genitourinary: Negative.    PHYSICAL EXAMINATION:    BP (!) 132/101 (BP Location: Right Arm, Patient Position: Sitting, Cuff Size: Small)   Pulse 82   Ht '5\' 5"'$  (1.651 m) Comment: reported  Wt 132 lb 9.6 oz (60.1 kg)   LMP 07/03/2008   BMI 22.07 kg/m     General appearance: alert, cooperative and appears  stated age Lymph:  no inguinal LAD noted  Pelvic: External genitalia:  hypopigmentation is improved, no thickened areas present, no ulcerations              Urethra:  normal appearing urethra with no masses, tenderness or lesions              Bartholins and Skenes: normal                  Chaperone, Octaviano Batty, CMA, was present for exam.  Assessment/Plan: 1. Lichen sclerosus - discuss with pt using topical steroid for maintenance of therapy.  Pt comfortable with this plan. - mometasone (ELOCON) 0.1 % ointment; Apply topically no more than twice weekly  Dispense: 45 g; Refill: 1  2. Stress incontinence of urine - Ambulatory referral to Physical Therapy

## 2021-08-09 ENCOUNTER — Other Ambulatory Visit: Payer: Self-pay

## 2021-08-09 ENCOUNTER — Ambulatory Visit: Payer: Medicare Other | Attending: Nurse Practitioner | Admitting: Physical Therapy

## 2021-08-09 ENCOUNTER — Encounter: Payer: Self-pay | Admitting: Physical Therapy

## 2021-08-09 DIAGNOSIS — R293 Abnormal posture: Secondary | ICD-10-CM

## 2021-08-09 DIAGNOSIS — R279 Unspecified lack of coordination: Secondary | ICD-10-CM | POA: Insufficient documentation

## 2021-08-09 DIAGNOSIS — M6281 Muscle weakness (generalized): Secondary | ICD-10-CM | POA: Diagnosis not present

## 2021-08-09 NOTE — Patient Instructions (Signed)

## 2021-08-09 NOTE — Therapy (Signed)
[facility] [address], [place], [place], [address] Phone: [phone]   Fax:  [phone]  Physical Therapy Evaluation  Patient Details  Name: Sarah Montoya MRN: [id number] Date of Birth: 1951-02-18 Referring Provider (PT): Megan Salon, MD   Encounter Date: 08/09/2021   PT End of Session - 08/09/21 1356     Visit Number 1    Date for PT Re-Evaluation 11/08/21    Authorization Type medicare    Progress Note Due on Visit 10    PT Start Time 1240   pt arrived late   PT Stop Time 1314    PT Time Calculation (min) 34 min    Activity Tolerance Patient tolerated treatment well    Behavior During Therapy [facility] for tasks assessed/performed             Past Medical History:  Diagnosis Date   Basal cell carcinoma    Drowsiness    excessive daytime   Hx of migraines    Hypersomnia 08/26/2013   Hypothyroidism    Hypothyroidism 10/21/2014   Narcolepsy    Parkinson disease ([place])     Past Surgical History:  Procedure Laterality Date   MENISCUS REPAIR Right 05/2014    There were no vitals filed for this visit.    Subjective Assessment - 08/09/21 1244     Subjective Pt reports urinary leakage with exercises mostly with sit ups, walking, strong urge, urinating at least every 2 hours, and wears pad daily ~1 per day and not at night. But does go to restroom 1-2x per night without leakage.    How long can you sit comfortably? no limits    How long can you stand comfortably? no limits    How long can you walk comfortably? no limits    Currently in Pain? No/denies                [facility] PT Assessment - 08/09/21 0001       Assessment   Medical Diagnosis N39.3 (ICD-10-CM) - Stress incontinence of urine    Referring Provider (PT) Megan Salon, MD    Onset Date/Surgical Date --   a few years   Prior Therapy for knee years ago      Precautions   Precautions None      Restrictions   Weight Bearing  Restrictions No      Balance Screen   Has the patient fallen in the past 6 months No    Has the patient had a decrease in activity level because of a fear of falling?  No    Is the patient reluctant to leave their home because of a fear of falling?  No      Home Ecologist residence    Living Arrangements Spouse/significant other      Prior Function   Level of [place] Retired    Leisure exercising an hour- two hours, reading, Statistician   Overall Cognitive Status Within Functional Limits for tasks assessed      Sensation   Light Touch Appears Intact      Coordination   Gross Motor Movements are Fluid and Coordinated Yes    Fine Motor Movements are Fluid and Coordinated Yes      Posture/Postural Control   Posture/Postural Control Postural limitations    Postural Limitations Rounded Shoulders;Posterior pelvic tilt  ROM / Strength   AROM / PROM / Strength AROM;Strength      AROM   Overall AROM Comments WFL      Strength   Overall Strength Comments bil hips and knees 4+/5      Flexibility   Soft Tissue Assessment /Muscle Length yes   adductors and hamstrings limited by 25%     Palpation   Spinal mobility decreased rib mobility with chest breathing noted and moderate cues to improve expansion however decreased mobility noted                   No emotional/communication barriers or cognitive limitation. Patient is motivated to learn. Patient understands and agrees with treatment goals and plan. PT explains patient will be examined in standing, sitting, and lying down to see how their muscles and joints work. When they are ready, they will be asked to remove their underwear so PT can examine their perineum. The patient is also given the option of providing their own chaperone as one is not provided in our facility. The patient also has the right and is explained the right to defer or  refuse any part of the evaluation or treatment including the internal exam. With the patient's consent, PT will use one gloved finger to gently assess the muscles of the pelvic floor, seeing how well it contracts and relaxes and if there is muscle symmetry. After, the patient will get dressed and PT and patient will discuss exam findings and plan of care. PT and patient discuss plan of care, schedule, attendance policy and HEP activities.      Objective measurements completed on examination: See above findings.     Pelvic Floor Special Questions - 08/09/21 0001     Prior Pelvic/Prostate Exam Yes    Are you Pregnant or attempting pregnancy? No    Prior Pregnancies Yes    Number of Pregnancies 3    Number of Vaginal Deliveries 3    Any difficulty with labor and deliveries Yes   remembers one having moderate tearing but unaware of the others   Currently Sexually Active Yes    Is this Painful No   with use of lubricant   History of sexually transmitted disease No    Marinoff Scale no problems    Urinary Leakage Yes    How often daily multiple times a day    Pad use 1 daily    Activities that cause leaking With strong urge;Coughing;Sneezing;Walking;Exercising;Lifting    Fecal incontinence No    Fluid intake drinks a lot of water but less than halk body weight in oz    Caffeine beverages no    Falling out feeling (prolapse) No    Skin Integrity Erthema    Pelvic Floor Internal Exam patient identified and patient confirms consent for PT to perform internal soft tissue work and muscle strength and integrity assessment    Exam Type Vaginal    Sensation WFL    Palpation no tendernesss externally or internally with exam    Strength weak squeeze, no lift    Strength # of reps 4    Strength # of seconds 4    Tone decreased - noted compensation of abdominals, glutes and thighs with contraction and holding breath                       PT Education - 08/09/21 1355     Education  Details pt educated on exam findings, POC,  and HEP    Person(s) Educated Patient    Methods Explanation;Demonstration;Tactile cues;Verbal cues;Handout    Comprehension Verbalized understanding;Returned demonstration              PT Short Term Goals - 08/09/21 1413       PT SHORT TERM GOAL #1   Title pt to be I with HEP    Time 5    Period Weeks    Status New    Target Date 09/13/21      PT SHORT TERM GOAL #2   Title pt to demonstrate at least 3/5 pelvic floor strength to decrease urinary leakage.    Time 5    Period Weeks    Status New    Target Date 09/13/21      PT SHORT TERM GOAL #3   Title pt to demonstrate improved coordination with breathing mechanics and pelvoc floor/core contract/relax at least 50% of the time for dereased strain on pelvic floor    Time 5    Period Weeks    Status New    Target Date 09/13/21      PT SHORT TERM GOAL #4   Title pt to report no more than 3 leaks per week with activity to demonstrate decreased leakage overall.    Time 5    Period Weeks    Status New    Target Date 09/13/21               PT Long Term Goals - 08/09/21 1416       PT LONG TERM GOAL #1   Title pt to be I with advanced HEP    Time 3    Period Months    Status New    Target Date 11/08/21      PT LONG TERM GOAL #2   Title pt to demonstrate at least 3/5 pelvic floor strength to decrease urinary leakage.    Time 3    Period Months    Status New    Target Date 11/08/21      PT LONG TERM GOAL #3   Title pt to demonstrate improved coordination with breathing mechanics and pelvoc floor/core contract/relax at least 75% of the time for dereased strain on pelvic floor    Time 3    Period Months    Status New    Target Date 11/08/21      PT LONG TERM GOAL #4   Title pt to report no more than 2 leaks per month with activity to demonstrate decreased leakage overall    Time 3    Period Months    Status New    Target Date 11/08/21                     Plan - 08/09/21 1357     Clinical Impression Statement Pt is 70yo female presenting to clinic chronic history of urinary leakage with working, walking, sneezing and coughing and uses one pad per day, none at night and does wake 1-2x per night to urinate but doesn't have leakage. Pt found to have deficits in breathing mechanics, rib mobility and with internal pelvic floor assessment pt found to have weakness in all quadrants of pelvic floor, decreased endurance and coordination as well. Pt had no pain throughout session and educated on bladder irritants and bladder diary. Pt denied additional questions at end of session and agreeable to POC, HEP, and recommendations.    Personal Factors and Comorbidities Age;Time since onset  of injury/illness/exacerbation;Comorbidity 1    Comorbidities 3 vaginal births with tearing    Examination-Activity Limitations Lift;Stairs;Squat;Continence;Locomotion Level    Examination-Participation Restrictions Community Activity;Yard Work;Shop    Stability/Clinical Decision Making Stable/Uncomplicated    Clinical Decision Making Low    Rehab Potential Good    PT Frequency Biweekly    PT Duration 8 weeks    PT Treatment/Interventions ADLs/Self Care Home Management;Aquatic Therapy;Functional mobility training;Therapeutic activities;Therapeutic exercise;Neuromuscular re-education;Manual techniques;Patient/family education;Passive range of motion;Energy conservation;Taping    PT Next Visit Plan pelvic/core relax/contract with activity and proper breathing mechanics    PT Home Exercise Plan Access Code: [email or web address]    Consulted and Agree with Plan of Care Patient             Patient will benefit from skilled therapeutic intervention in order to improve the following deficits and impairments:  Decreased coordination, Increased fascial restricitons, Improper body mechanics, Impaired flexibility, Decreased mobility, Decreased strength, Postural  dysfunction  Visit Diagnosis: Muscle weakness (generalized) - Plan: PT plan of care cert/re-cert  Lack of coordination - Plan: PT plan of care cert/re-cert  Abnormal posture - Plan: PT plan of care cert/re-cert     Problem List Patient Active Problem List   Diagnosis Date Noted   Medication monitoring encounter 04/01/2018   Parkinson's disease ([place]) 04/01/2018   Muscle tone increased 06/12/2017   Bradykinesia 06/12/2017   Dysphonia 06/12/2017   Raynaud's phenomenon without gangrene 06/12/2017   Tarsal tunnel syndrome 02/21/2017   Hypothyroidism 10/21/2014   Hypersomnia, persistent 10/21/2014   Injury of meniscus of right knee 07/22/2014   Subacromial bursitis 02/04/2013   Abnormality of gait 08/13/2012   Enthesopathy of ankle and tarsus 10/04/2009   Stacy Gardner, PT, DPT 09/21/222:21 PM   [facility] [address] [place], [place], [address] Phone: [phone]   Fax:  [phone]  Name: Sarah Montoya MRN: [id number] Date of Birth: 17-Jun-1951

## 2021-08-16 ENCOUNTER — Ambulatory Visit: Payer: Medicare Other | Admitting: Adult Health

## 2021-08-24 ENCOUNTER — Other Ambulatory Visit (HOSPITAL_BASED_OUTPATIENT_CLINIC_OR_DEPARTMENT_OTHER): Payer: Self-pay | Admitting: Obstetrics & Gynecology

## 2021-08-24 ENCOUNTER — Encounter (HOSPITAL_BASED_OUTPATIENT_CLINIC_OR_DEPARTMENT_OTHER): Payer: Self-pay

## 2021-08-24 MED ORDER — NONFORMULARY OR COMPOUNDED ITEM: 3 refills | Status: DC

## 2021-08-28 ENCOUNTER — Other Ambulatory Visit: Payer: Self-pay

## 2021-08-28 ENCOUNTER — Ambulatory Visit: Payer: Medicare Other | Attending: Internal Medicine | Admitting: Physical Therapy

## 2021-08-28 DIAGNOSIS — R2689 Other abnormalities of gait and mobility: Secondary | ICD-10-CM | POA: Diagnosis not present

## 2021-08-28 DIAGNOSIS — R279 Unspecified lack of coordination: Secondary | ICD-10-CM | POA: Diagnosis not present

## 2021-08-28 DIAGNOSIS — M6281 Muscle weakness (generalized): Secondary | ICD-10-CM | POA: Diagnosis not present

## 2021-08-28 NOTE — Therapy (Signed)
[facility] [place], [place], [address] Phone: [phone]   Fax:  [phone]  Physical Therapy Treatment  Patient Details  Name: Sarah Montoya MRN: [id number] Date of Birth: 12/11/1950 Referring Provider (PT): Megan Salon, MD   Encounter Date: 08/28/2021   PT End of Session - 08/28/21 1320     Visit Number 2    Date for PT Re-Evaluation 11/08/21    Authorization Type medicare    Progress Note Due on Visit 10    PT Start Time 1232    PT Stop Time 1316    PT Time Calculation (min) 44 min    Activity Tolerance Patient tolerated treatment well    Behavior During Therapy [facility] for tasks assessed/performed             Past Medical History:  Diagnosis Date   Basal cell carcinoma    Drowsiness    excessive daytime   Hx of migraines    Hypersomnia 08/26/2013   Hypothyroidism    Hypothyroidism 10/21/2014   Narcolepsy    Parkinson disease [facility])     Past Surgical History:  Procedure Laterality Date   MENISCUS REPAIR Right 05/2014    There were no vitals filed for this visit.                   Pelvic Floor Special Questions - 08/28/21 0001     Pelvic Floor Internal Exam patient identified and patient confirms consent for PT to perform internal soft tissue work and muscle strength and integrity assessment    Exam Type Vaginal    Sensation WFL    Palpation no tendernesss externally or internally with exam    Strength weak squeeze, no lift    Strength # of reps 6    Strength # of seconds 4               [facility] Adult PT Treatment/Exercise - 08/28/21 0001       Self-Care   Self-Care Other Self-Care Comments    Other Self-Care Comments  Pt educated on bladder diary, HEP, vaginal weights.      Neuro Re-ed    Neuro Re-ed Details  Pt directed in internal pelvic floor strengthening at vaginal opening with 2x10 contractions, 9C78 quick flicks and 93Y1O holds. Pt benefited  from quick release at all quadrants to improve activation and isolation for proper techniques and extra time to complete.                     PT Education - 08/28/21 1320     Education Details Pt educated on HEP, vaginal weights, and bladder diary.    Person(s) Educated Patient    Methods Explanation;Demonstration;Tactile cues;Verbal cues;Handout    Comprehension Verbalized understanding;Returned demonstration              PT Short Term Goals - 08/09/21 1413       PT SHORT TERM GOAL #1   Title pt to be I with HEP    Time 5    Period Weeks    Status New    Target Date 09/13/21      PT SHORT TERM GOAL #2   Title pt to demonstrate at least 3/5 pelvic floor strength to decrease urinary leakage.    Time 5    Period Weeks    Status New    Target Date 09/13/21      PT SHORT TERM  GOAL #3   Title pt to demonstrate improved coordination with breathing mechanics and pelvoc floor/core contract/relax at least 50% of the time for dereased strain on pelvic floor    Time 5    Period Weeks    Status New    Target Date 09/13/21      PT SHORT TERM GOAL #4   Title pt to report no more than 3 leaks per week with activity to demonstrate decreased leakage overall.    Time 5    Period Weeks    Status New    Target Date 09/13/21               PT Long Term Goals - 08/09/21 1416       PT LONG TERM GOAL #1   Title pt to be I with advanced HEP    Time 3    Period Months    Status New    Target Date 11/08/21      PT LONG TERM GOAL #2   Title pt to demonstrate at least 3/5 pelvic floor strength to decrease urinary leakage.    Time 3    Period Months    Status New    Target Date 11/08/21      PT LONG TERM GOAL #3   Title pt to demonstrate improved coordination with breathing mechanics and pelvoc floor/core contract/relax at least 75% of the time for dereased strain on pelvic floor    Time 3    Period Months    Status New    Target Date 11/08/21      PT LONG  TERM GOAL #4   Title pt to report no more than 2 leaks per month with activity to demonstrate decreased leakage overall    Time 3    Period Months    Status New    Target Date 11/08/21                   Plan - 08/28/21 1321     Clinical Impression Statement Pt presents to clinic reporting some improvement with urgency with decreasing selzer water intake. Pt had multiple questions about HEP and bladder diary and kegel technique. All answered and educated pt on these as well as vaginal weights as she reported she doesn't know if shes doing it correctly. Pt treatment session also foused on internal NMRE for improved activation, coordination, and endurance training of pelvic floor muscles. Pt required multiple cues and extra time to complete and isolate.  Pt denied additional questions at end of session and agreeable to POC, HEP, and recommendations.    Personal Factors and Comorbidities Age;Time since onset of injury/illness/exacerbation;Comorbidity 1    Comorbidities 3 vaginal births with tearing    Examination-Activity Limitations Lift;Stairs;Squat;Continence;Locomotion Level    Examination-Participation Restrictions Community Activity;Yard Work;Shop    Stability/Clinical Decision Making Stable/Uncomplicated    Clinical Decision Making Low    Rehab Potential Good    PT Frequency Biweekly    PT Duration 8 weeks    PT Treatment/Interventions ADLs/Self Care Home Management;Aquatic Therapy;Functional mobility training;Therapeutic activities;Therapeutic exercise;Neuromuscular re-education;Manual techniques;Patient/family education;Passive range of motion;Energy conservation;Taping    PT Next Visit Plan pelvic/core relax/contract with activity and proper breathing mechanics    PT Home Exercise Plan Access Code: [email or web address]    Consulted and Agree with Plan of Care Patient             Patient will benefit from skilled therapeutic intervention in order to improve the following deficits  and  impairments:  Decreased coordination, Increased fascial restricitons, Improper body mechanics, Impaired flexibility, Decreased mobility, Decreased strength, Postural dysfunction  Visit Diagnosis: Lack of coordination  Muscle weakness (generalized)  Other abnormalities of gait and mobility     Problem List Patient Active Problem List   Diagnosis Date Noted   Medication monitoring encounter 04/01/2018   Parkinson's disease ([place]) 04/01/2018   Muscle tone increased 06/12/2017   Bradykinesia 06/12/2017   Dysphonia 06/12/2017   Raynaud's phenomenon without gangrene 06/12/2017   Tarsal tunnel syndrome 02/21/2017   Hypothyroidism 10/21/2014   Hypersomnia, persistent 10/21/2014   Injury of meniscus of right knee 07/22/2014   Subacromial bursitis 02/04/2013   Abnormality of gait 08/13/2012   Enthesopathy of ankle and tarsus 10/04/2009    Stacy Gardner, PT, DPT 10/10/221:24 PM   [facility] @ [place], [place], [address] Phone: [phone]   Fax:  [phone]  Name: Sarah Montoya MRN: [id number] Date of Birth: [birth year]

## 2021-08-29 DIAGNOSIS — Z23 Encounter for immunization: Secondary | ICD-10-CM | POA: Diagnosis not present

## 2021-08-30 ENCOUNTER — Other Ambulatory Visit (HOSPITAL_BASED_OUTPATIENT_CLINIC_OR_DEPARTMENT_OTHER): Payer: Self-pay | Admitting: Obstetrics & Gynecology

## 2021-09-11 ENCOUNTER — Ambulatory Visit: Payer: Medicare Other | Admitting: Physical Therapy

## 2021-09-11 ENCOUNTER — Other Ambulatory Visit: Payer: Self-pay

## 2021-09-11 DIAGNOSIS — R279 Unspecified lack of coordination: Secondary | ICD-10-CM

## 2021-09-11 DIAGNOSIS — M6281 Muscle weakness (generalized): Secondary | ICD-10-CM | POA: Diagnosis not present

## 2021-09-11 DIAGNOSIS — R2689 Other abnormalities of gait and mobility: Secondary | ICD-10-CM | POA: Diagnosis not present

## 2021-09-11 NOTE — Therapy (Signed)
[facility] @ [place], [place], [address] Phone: [phone]   Fax:  [phone]  Physical Therapy Treatment  Patient Details  Name: Kelechi Astarita MRN: [id number] Date of Birth: 01-02-1951 Referring Provider (PT): Megan Salon, MD   Encounter Date: 09/11/2021   PT End of Session - 09/11/21 1241     Visit Number 3    Date for PT Re-Evaluation 11/08/21    Authorization Type medicare    Progress Note Due on Visit 10    PT Start Time 0932    PT Stop Time 1312    PT Time Calculation (min) 37 min    Activity Tolerance Patient tolerated treatment well    Behavior During Therapy [facility] for tasks assessed/performed             Past Medical History:  Diagnosis Date   Basal cell carcinoma    Drowsiness    excessive daytime   Hx of migraines    Hypersomnia 08/26/2013   Hypothyroidism    Hypothyroidism 10/21/2014   Narcolepsy    Parkinson disease ([place])     Past Surgical History:  Procedure Laterality Date   MENISCUS REPAIR Right 05/2014    There were no vitals filed for this visit.   Subjective Assessment - 09/11/21 1237     Subjective Pt reports she feels she is leaking less often and smaller amounts compared to prior to therapy and pad is less damp at end of the day. Now able to urinate 1x per night. Jumping in aerobics class and only occasionally leaking with strong urge or sit ups. Pt reports she urinates now between 2-4 hours.    How long can you sit comfortably? no limits    How long can you stand comfortably? no limits    How long can you walk comfortably? no limits    Currently in Pain? No/denies                               [facility] Adult PT Treatment/Exercise - 09/11/21 0001       Exercises   Exercises Lumbar;Knee/Hip      Lumbar Exercises: Stretches   Other Lumbar Stretch Exercise childs pose x30s and happy baby x30s      Lumbar Exercises: Aerobic   Elliptical  5 mins L2 with cues to not hold breath      Lumbar Exercises: Standing   Functional Squats 20 reps    Functional Squats Limitations 2x10 5# kettle bell      Lumbar Exercises: Supine   Clam 20 reps    Clam Limitations 2x10 black loop    Bridge 20 reps    Bridge Limitations 2x10 cues for breathing mechanics      Lumbar Exercises: Quadruped   Opposite Arm/Leg Raise Right arm/Left leg;Left arm/Right leg;10 reps      Knee/Hip Exercises: Machines for Strengthening   Cybex Leg Press 90# both legs x15; SL x10 50#                     PT Education - 09/11/21 1251     Education Details Pt educated on all exercises with breathing mechanics    Person(s) Educated Patient    Methods Explanation;Demonstration;Tactile cues;Verbal cues    Comprehension Returned demonstration;Verbalized understanding              PT Short Term Goals - 08/09/21 1413  PT SHORT TERM GOAL #1   Title pt to be I with HEP    Time 5    Period Weeks    Status New    Target Date 09/13/21      PT SHORT TERM GOAL #2   Title pt to demonstrate at least 3/5 pelvic floor strength to decrease urinary leakage.    Time 5    Period Weeks    Status New    Target Date 09/13/21      PT SHORT TERM GOAL #3   Title pt to demonstrate improved coordination with breathing mechanics and pelvoc floor/core contract/relax at least 50% of the time for dereased strain on pelvic floor    Time 5    Period Weeks    Status New    Target Date 09/13/21      PT SHORT TERM GOAL #4   Title pt to report no more than 3 leaks per week with activity to demonstrate decreased leakage overall.    Time 5    Period Weeks    Status New    Target Date 09/13/21               PT Long Term Goals - 08/09/21 1416       PT LONG TERM GOAL #1   Title pt to be I with advanced HEP    Time 3    Period Months    Status New    Target Date 11/08/21      PT LONG TERM GOAL #2   Title pt to demonstrate at least 3/5 pelvic  floor strength to decrease urinary leakage.    Time 3    Period Months    Status New    Target Date 11/08/21      PT LONG TERM GOAL #3   Title pt to demonstrate improved coordination with breathing mechanics and pelvoc floor/core contract/relax at least 75% of the time for dereased strain on pelvic floor    Time 3    Period Months    Status New    Target Date 11/08/21      PT LONG TERM GOAL #4   Title pt to report no more than 2 leaks per month with activity to demonstrate decreased leakage overall    Time 3    Period Months    Status New    Target Date 11/08/21                   Plan - 09/11/21 1241     Clinical Impression Statement Pt presents to clinic reported improvement with urgency and frequency and overall decreased amount of leakage. Pt reports pads at the end of the day are much drier compared to prior to therapy. Pt session focused on hip and core strengthening with proper breathing mechanics. Pt tolerated session well with cues for technique and breathing mechanics. Pt would benefit from continued PT to address deficits found and decrease leakage.    Personal Factors and Comorbidities Age;Time since onset of injury/illness/exacerbation;Comorbidity 1    Comorbidities 3 vaginal births with tearing    Examination-Activity Limitations Lift;Stairs;Squat;Continence;Locomotion Level    Examination-Participation Restrictions Community Activity;Yard Work;Shop    Stability/Clinical Decision Making Stable/Uncomplicated    Clinical Decision Making Low    Rehab Potential Good    PT Frequency Biweekly    PT Duration 8 weeks    PT Treatment/Interventions ADLs/Self Care Home Management;Aquatic Therapy;Functional mobility training;Therapeutic activities;Therapeutic exercise;Neuromuscular re-education;Manual techniques;Patient/family education;Passive range of motion;Energy conservation;Taping  PT Next Visit Plan pelvic/core relax/contract with activity and proper breathing  mechanics    PT Home Exercise Plan Access Code: [email or web address]    Consulted and Agree with Plan of Care Patient             Patient will benefit from skilled therapeutic intervention in order to improve the following deficits and impairments:  Decreased coordination, Increased fascial restricitons, Improper body mechanics, Impaired flexibility, Decreased mobility, Decreased strength, Postural dysfunction  Visit Diagnosis: Muscle weakness (generalized)  Lack of coordination     Problem List Patient Active Problem List   Diagnosis Date Noted   Medication monitoring encounter 04/01/2018   Parkinson's disease ([place]) 04/01/2018   Muscle tone increased 06/12/2017   Bradykinesia 06/12/2017   Dysphonia 06/12/2017   Raynaud's phenomenon without gangrene 06/12/2017   Tarsal tunnel syndrome 02/21/2017   Hypothyroidism 10/21/2014   Hypersomnia, persistent 10/21/2014   Injury of meniscus of right knee 07/22/2014   Subacromial bursitis 02/04/2013   Abnormality of gait 08/13/2012   Enthesopathy of ankle and tarsus 10/04/2009   Stacy Gardner, PT, DPT 10/24/221:12 PM   [facility] @ [place], [place], [address] Phone: [phone]   Fax:  [phone]  Name: Kwanza Cancelliere MRN: [id number] Date of Birth: [birth year]

## 2021-09-13 ENCOUNTER — Other Ambulatory Visit: Payer: Self-pay | Admitting: Adult Health

## 2021-09-13 DIAGNOSIS — G20 Parkinson's disease: Secondary | ICD-10-CM

## 2021-09-14 DIAGNOSIS — E538 Deficiency of other specified B group vitamins: Secondary | ICD-10-CM | POA: Diagnosis not present

## 2021-09-14 DIAGNOSIS — I7300 Raynaud's syndrome without gangrene: Secondary | ICD-10-CM | POA: Diagnosis not present

## 2021-09-14 DIAGNOSIS — Z1211 Encounter for screening for malignant neoplasm of colon: Secondary | ICD-10-CM | POA: Diagnosis not present

## 2021-09-14 DIAGNOSIS — G43909 Migraine, unspecified, not intractable, without status migrainosus: Secondary | ICD-10-CM | POA: Diagnosis not present

## 2021-09-14 DIAGNOSIS — E559 Vitamin D deficiency, unspecified: Secondary | ICD-10-CM | POA: Diagnosis not present

## 2021-09-14 DIAGNOSIS — Z79899 Other long term (current) drug therapy: Secondary | ICD-10-CM | POA: Diagnosis not present

## 2021-09-14 DIAGNOSIS — E039 Hypothyroidism, unspecified: Secondary | ICD-10-CM | POA: Diagnosis not present

## 2021-09-14 DIAGNOSIS — Z0001 Encounter for general adult medical examination with abnormal findings: Secondary | ICD-10-CM | POA: Diagnosis not present

## 2021-09-14 DIAGNOSIS — Z1239 Encounter for other screening for malignant neoplasm of breast: Secondary | ICD-10-CM | POA: Diagnosis not present

## 2021-09-14 DIAGNOSIS — Z8639 Personal history of other endocrine, nutritional and metabolic disease: Secondary | ICD-10-CM | POA: Diagnosis not present

## 2021-09-14 DIAGNOSIS — G20 Parkinson's disease: Secondary | ICD-10-CM | POA: Diagnosis not present

## 2021-09-16 ENCOUNTER — Other Ambulatory Visit: Payer: Self-pay | Admitting: Adult Health

## 2021-09-19 ENCOUNTER — Telehealth: Payer: Self-pay | Admitting: *Deleted

## 2021-09-19 NOTE — Telephone Encounter (Signed)
Sarah Montoya KeyHerbie Drape - PA Case ID: [id number] - Rx #: [id number]  PA Rasagilne was sent waiting on approval

## 2021-09-20 NOTE — Telephone Encounter (Signed)
PA Rasagline has been approved . Approval dates is good until 11/18/2022 . Will contact patient through mychart to make her aware. Will also fax prescription tp pharmacy.

## 2021-10-03 ENCOUNTER — Encounter: Payer: Medicare Other | Admitting: Physical Therapy

## 2021-10-25 ENCOUNTER — Other Ambulatory Visit: Payer: Self-pay

## 2021-10-25 ENCOUNTER — Ambulatory Visit: Payer: Medicare Other | Attending: Internal Medicine | Admitting: Physical Therapy

## 2021-10-25 DIAGNOSIS — R279 Unspecified lack of coordination: Secondary | ICD-10-CM | POA: Diagnosis not present

## 2021-10-25 DIAGNOSIS — M6281 Muscle weakness (generalized): Secondary | ICD-10-CM | POA: Diagnosis not present

## 2021-10-25 DIAGNOSIS — R293 Abnormal posture: Secondary | ICD-10-CM | POA: Insufficient documentation

## 2021-10-25 NOTE — Therapy (Addendum)
[facility] @ [place], [place], [address] Phone: [phone]   Fax:  [phone]  Physical Therapy Treatment  Patient Details  Name: Stephannie Broner MRN: [id number] Date of Birth: 03/01/1951 Referring Provider (PT): Megan Salon, MD   Encounter Date: 10/25/2021   PT End of Session - 10/25/21 1525     Visit Number 4    Date for PT Re-Evaluation 11/08/21    Authorization Type medicare    Progress Note Due on Visit 10    PT Start Time 6962    PT Stop Time 1521    PT Time Calculation (min) 36 min    Activity Tolerance Patient tolerated treatment well    Behavior During Therapy [facility] for tasks assessed/performed             Past Medical History:  Diagnosis Date   Basal cell carcinoma    Drowsiness    excessive daytime   Hx of migraines    Hypersomnia 08/26/2013   Hypothyroidism    Hypothyroidism 10/21/2014   Narcolepsy    Parkinson disease ([place])     Past Surgical History:  Procedure Laterality Date   MENISCUS REPAIR Right 05/2014    There were no vitals filed for this visit.   Subjective Assessment - 10/25/21 1449     Subjective Pt reports "I really feel like I have been improving". Pt reports she only has leakage now when she is does sit ups. No other leakage. Pt reports she is now able to ambulate her usual 3 miles without having stop to urinate now.                     No emotional/communication barriers or cognitive limitation. Patient is motivated to learn. Patient understands and agrees with treatment goals and plan. PT explains patient will be examined in standing, sitting, and lying down to see how their muscles and joints work. When they are ready, they will be asked to remove their underwear so PT can examine their perineum. The patient is also given the option of providing their own chaperone as one is not provided in our facility. The patient also has the right and is explained  the right to defer or refuse any part of the evaluation or treatment including the internal exam. With the patient's consent, PT will use one gloved finger to gently assess the muscles of the pelvic floor, seeing how well it contracts and relaxes and if there is muscle symmetry. After, the patient will get dressed and PT and patient will discuss exam findings and plan of care. PT and patient discuss plan of care, schedule, attendance policy and HEP activities.        Pelvic Floor Special Questions - 10/25/21 0001     External Perineal Exam WFL, no reddness    Prolapse Anterior Wall   mild anterior wall possible grade 1. Pt denied all symptoms.   Pelvic Floor Internal Exam patient identified and patient confirms consent for PT to perform internal soft tissue work and muscle strength and integrity assessment    Exam Type Vaginal    Sensation WFL    Palpation no tendernesss externally or internally with exam    Strength fair squeeze, definite lift    Strength # of reps 8    Strength # of seconds 6    Tone does need to cues to decrease holding breath with isometrics at pelvic floor  [facility] Adult PT Treatment/Exercise - 10/25/21 0001       Exercises   Exercises Lumbar;Knee/Hip      Lumbar Exercises: Standing   Other Standing Lumbar Exercises palloffs red band x10; rotational palloffs red band x10 each      Lumbar Exercises: Supine   Other Supine Lumbar Exercises x10 TA activations with breathing mechanics; x10 same side knee/arm press with exhale;      Lumbar Exercises: Quadruped   Opposite Arm/Leg Raise Right arm/Left leg;Left arm/Right leg;10 reps                     PT Education - 10/25/21 1524     Education Details Pt educated on HEP [id number] for alternative core exercises and breathing mechanics for all exercises to decrease strain at pelvic floor and core retraining.    Person(s) Educated Patient    Methods Explanation;Demonstration;Tactile  cues;Verbal cues;Handout    Comprehension Verbalized understanding;Returned demonstration              PT Short Term Goals - 10/25/21 1529       PT SHORT TERM GOAL #1   Title pt to be I with HEP    Time 5    Period Weeks    Status Achieved    Target Date 09/13/21      PT SHORT TERM GOAL #2   Title pt to demonstrate at least 3/5 pelvic floor strength to decrease urinary leakage.    Time 5    Period Weeks    Status Achieved    Target Date 09/13/21      PT SHORT TERM GOAL #3   Title pt to demonstrate improved coordination with breathing mechanics and pelvoc floor/core contract/relax at least 50% of the time for dereased strain on pelvic floor    Time 5    Period Weeks    Status Achieved    Target Date 09/13/21      PT SHORT TERM GOAL #4   Title pt to report no more than 3 leaks per week with activity to demonstrate decreased leakage overall.    Time 5    Period Weeks    Status Achieved    Target Date 09/13/21               PT Long Term Goals - 10/25/21 1529       PT LONG TERM GOAL #1   Title pt to be I with advanced HEP    Time 3    Period Months    Status Achieved    Target Date 11/08/21      PT LONG TERM GOAL #2   Title pt to demonstrate at least 3/5 pelvic floor strength to decrease urinary leakage.    Time 3    Period Months    Status Achieved    Target Date 11/08/21      PT LONG TERM GOAL #3   Title pt to demonstrate improved coordination with breathing mechanics and pelvoc floor/core contract/relax at least 75% of the time for dereased strain on pelvic floor    Time 3    Period Months    Status Partially Met    Target Date 11/08/21      PT LONG TERM GOAL #4   Title pt to report no more than 2 leaks per month with activity to demonstrate decreased leakage overall    Time 3    Period Months    Status Partially Met    Target  Date 11/08/21                   Plan - 10/25/21 1525     Clinical Impression Statement Pt reports at  clinic she is very pleased with PFPT, reporting she has noticed a great improvment in leakage symptoms and now only has small amount of urinary leakage with situps. Pt educated on breathing mechanics and core activation with this to brace abominals for activity. Pt also given core strengthening alternatives to decrease stress at pelvic floor. Pt demonstrated good techniques with these and denied questions. Pt also consented to internal pelvic floor assessment/treatment with improvement noted in strength, endurance, and coordination. Pt did demonstrate a mild prolapse potentially grade one and initially demonstrate bulge techniques with contraction, with mild cues pt improved and no longer noted prolapse at anterior wall. Pt educated on pressure management and breathing techniques with all exercises and pt agreed. Pt denied all prolapse symptoms. Pt tolerated well. Pt may benefit from additional PT for improved coordination of breathing mechanics with pelvic floor contraction/relaxation with activities.    Personal Factors and Comorbidities Age;Time since onset of injury/illness/exacerbation;Comorbidity 1    Comorbidities 3 vaginal births with tearing    Examination-Activity Limitations Lift;Stairs;Squat;Continence;Locomotion Level    Examination-Participation Restrictions Community Activity;Yard Work;Shop    Stability/Clinical Decision Making Stable/Uncomplicated    Rehab Potential Good    PT Frequency Biweekly    PT Duration 8 weeks    PT Treatment/Interventions ADLs/Self Care Home Management;Aquatic Therapy;Functional mobility training;Therapeutic activities;Therapeutic exercise;Neuromuscular re-education;Manual techniques;Patient/family education;Passive range of motion;Energy conservation;Taping    PT Next Visit Plan pelvic/core relax/contract with activity and proper breathing mechanics    PT Home Exercise Plan Access Code: [email or web address]    Consulted and Agree with Plan of Care Patient              Patient will benefit from skilled therapeutic intervention in order to improve the following deficits and impairments:  Decreased coordination, Increased fascial restricitons, Improper body mechanics, Impaired flexibility, Decreased mobility, Decreased strength, Postural dysfunction  Visit Diagnosis: Muscle weakness (generalized)  Abnormal posture  Lack of coordination     Problem List Patient Active Problem List   Diagnosis Date Noted   Medication monitoring encounter 04/01/2018   Parkinson's disease ([place]) 04/01/2018   Muscle tone increased 06/12/2017   Bradykinesia 06/12/2017   Dysphonia 06/12/2017   Raynaud's phenomenon without gangrene 06/12/2017   Tarsal tunnel syndrome 02/21/2017   Hypothyroidism 10/21/2014   Hypersomnia, persistent 10/21/2014   Injury of meniscus of right knee 07/22/2014   Subacromial bursitis 02/04/2013   Abnormality of gait 08/13/2012   Enthesopathy of ankle and tarsus 10/04/2009   Stacy Gardner, PT, DPT 12/07/223:30 PM    PHYSICAL THERAPY DISCHARGE SUMMARY  Visits from Start of Care: 4  Current functional level related to goals / functional outcomes: Unable to formally reassess pt called to be Dc'd after last treatment   Remaining deficits: Unable to formally reassess   Education / Equipment: HEP   Patient agrees to discharge. Patient goals were partially met. Patient is being discharged due to being pleased with the current functional level. Pt called 11/01/21 to be Dc'd from PT and reported greatly improved symptoms and no additional needs for PT.  Stacy Gardner, PT, DPT 11/02/2211:56 PM   [facility] @ [place], [place], [address] Phone: [phone]   Fax:  [phone]  Name: Tajha Sammarco MRN: [id number] Date of Birth: [birth year]

## 2021-11-21 NOTE — Progress Notes (Signed)
PATIENT: Sarah Montoya DOB: 05/09/1951  REASON FOR VISIT: follow up HISTORY FROM: patient  HISTORY OF PRESENT ILLNESS: Today 11/21/21:  Sarah Montoya is a 71 year old female with a history of Parkinson disease.  She returns today for follow-up.  Overall she feels that her symptoms have remained stable.  She continues on Sinemet 3 times a day and Azilect 1 mg daily.  In the past we were thinking of switching her to selegiline due to cost the patient is now able to get Azilect through good Rx at a good price.  She denies any changes with her gait or balance.  Denies any trouble sleeping.  Able to complete all ADLs independently.  Reports good appetite.  No trouble chewing or swallowing food.  Denies tremor.  Returns today for an evaluation.  08/09/21: Sarah Montoya is a 71 year old female with a history of Parkinson's disease.  She returns today for follow-up.  She did see Dr. Deboraha Sprang in March of this year.  At that time she was doing well.  She feels that her symptoms have remained relatively stable.  Denies a significant tremor.  Denies any significant changes with her gait or balance.  Denies any trouble chewing or swallowing food.  She does report that rasagiline is quite expensive.  According to Dr. Liane Comber note he was okay with switching her to selegiline 5 mg twice a day.  She continues on Sinemet 1 tablet 3 times a day.  She also uses Adderall reports that she typically takes 5 mg occasionally in the afternoons as that is when her fatigue is the worst.  She does try to take a 20-minute nap daily.  Reports that she tries to stay well-hydrated.   REVIEW OF SYSTEMS: Out of a complete 14 system review of symptoms, the patient complains only of the following symptoms, and all other reviewed systems are negative.  See HPI  ALLERGIES: Allergies  Allergen Reactions   Ciprofloxacin Diarrhea    Upset stomach   Codeine Nausea Only    HOME MEDICATIONS: Outpatient Medications Prior to Visit   Medication Sig Dispense Refill   carbidopa-levodopa (SINEMET IR) 25-100 MG tablet TAKE 1 TABLET BY MOUTH THREE TIMES DAILY 270 tablet 0   Cholecalciferol (D3 HIGH POTENCY) 50 MCG (2000 UT) CAPS      clobetasol ointment (TEMOVATE) 0.05 % Apply bid for up to 7 days for itching 45 g 0   Cyanocobalamin (B-12) 1000 MCG CAPS      Lactase (LACTAID PO) Take by mouth as needed.     levothyroxine (SYNTHROID) 88 MCG tablet Take 88 mcg by mouth daily before breakfast.     mometasone (ELOCON) 0.1 % ointment Apply topically no more than twice weekly 45 g 1   NONFORMULARY OR COMPOUNDED ITEM Topical testosterone cream 1mg / 0.42ml.  Apply 0.5ml topically two times weekly. #13month supply/1RF 1 each 1   rasagiline (AZILECT) 1 MG TABS tablet Take 1 tablet by mouth once daily 30 tablet 2   SUMAtriptan (IMITREX) 100 MG tablet 100 mg as needed.     vitamin C (ASCORBIC ACID) 500 MG tablet Take by mouth.     No facility-administered medications prior to visit.    PAST MEDICAL HISTORY: Past Medical History:  Diagnosis Date   Basal cell carcinoma    Drowsiness    excessive daytime   Hx of migraines    Hypersomnia 08/26/2013   Hypothyroidism    Hypothyroidism 10/21/2014   Narcolepsy    Parkinson disease ([place])  PAST SURGICAL HISTORY: Past Surgical History:  Procedure Laterality Date   MENISCUS REPAIR Right 05/2014    FAMILY HISTORY: Family History  Problem Relation Age of Onset   Breast cancer Mother 80   Osteoporosis Mother    Prostate cancer Father    Thyroid disease Father    Mental retardation Sister     SOCIAL HISTORY: Social History   Socioeconomic History   Marital status: Married    Spouse name: Not on file   Number of children: 3   Years of education: college   Highest education level: Not on file  Occupational History   Occupation: Art therapist    Comment: Engineer, manufacturing  Tobacco Use   Smoking status: Never   Smokeless tobacco: Never  Vaping Use   Vaping Use: Never  used  Substance and Sexual Activity   Alcohol use: Not Currently    Alcohol/week: 0.0 standard drinks   Drug use: No   Sexual activity: Yes    Partners: Male    Birth control/protection: Post-menopausal  Other Topics Concern   Not on file  Social History Narrative   See previous notes.   Social Determinants of Health   Financial Resource Strain: Not on file  Food Insecurity: Not on file  Transportation Needs: Not on file  Physical Activity: Not on file  Stress: Not on file  Social Connections: Not on file  Intimate Partner Violence: Not on file      PHYSICAL EXAM  Vitals:   11/22/21 1319  BP: 132/82  Pulse: (!) 104  SpO2: 99%  Weight: 132 lb (59.9 kg)  Height: 5\' 5"  (1.651 m)    Body mass index is 21.97 kg/m.  Generalized: Well developed, in no acute distress   Neurological examination  Mentation: Alert oriented to time, place, history taking. Follows all commands speech and language fluent Cranial nerve II-XII: Pupils were equal round reactive to light. Extraocular movements were full, visual field were full on confrontational test. Facial sensation and strength were normal. Uvula tongue midline. Head turning and shoulder shrug  were normal and symmetric. Motor: The motor testing reveals 5 over 5 strength of all 4 extremities.  Mild impairment of finger taps bilaterally.  Good toe and heel taps bilaterally. Sensory: Sensory testing is intact to soft touch on all 4 extremities. No evidence of extinction is noted.  Coordination: Cerebellar testing reveals good finger-nose-finger and heel-to-shin bilaterally.  Gait and station: Patient able to stand without assistance.  Good stride.  Accentuates arm swing bilaterally Reflexes: Deep tendon reflexes are symmetric and normal bilaterally.   DIAGNOSTIC DATA (LABS, IMAGING, TESTING) - I reviewed patient records, labs, notes, testing and imaging myself where available.  Lab Results  Component Value Date   HGB 13.9  09/21/2014      Component Value Date/Time   NA 139 09/21/2014 1554   K 4.2 09/21/2014 1554   CL 103 09/21/2014 1554   CO2 26 09/21/2014 1554   GLUCOSE 98 09/21/2014 1554   BUN 15 09/21/2014 1554   CREATININE 0.71 09/21/2014 1554   CALCIUM 9.6 09/21/2014 1554   PROT 7.0 09/21/2014 1554   ALBUMIN 4.5 09/21/2014 1554   AST 22 09/21/2014 1554   ALT 20 09/21/2014 1554   ALKPHOS 58 09/21/2014 1554   BILITOT 0.4 09/21/2014 1554   Lab Results  Component Value Date   CHOL 191 09/21/2014   HDL 71 09/21/2014   LDLCALC 98 09/21/2014   TRIG 108 09/21/2014   CHOLHDL 2.7 09/21/2014   No  results found for: HGBA1C No results found for: VITAMINB12 Lab Results  Component Value Date   TSH 1.603 06/30/2013      ASSESSMENT AND PLAN 71 y.o. year old female  has a past medical history of Basal cell carcinoma, Drowsiness, migraines, Hypersomnia (08/26/2013), Hypothyroidism, Hypothyroidism (10/21/2014), Narcolepsy, and Parkinson disease ([place]). here with:  Parkinson's disease  Continue Sinemet 25-100 mg 3 times a day Continue rasagiline 1 mg daily Advised to follow-up in 1 year or sooner if needed Patient also sees Dr. Deboraha Sprang at [facility].  She alternates visits every 6 months   I spent 30 minutes of face-to-face and non-face-to-face time with patient.  This included previsit chart review, lab review, study review, order entry, electronic health record documentation, patient education.  Ward Givens, MSN, NP-C 11/21/2021, 4:09 PM [facility] [address], [place], [place] [address] [phone]

## 2021-11-22 ENCOUNTER — Ambulatory Visit (INDEPENDENT_AMBULATORY_CARE_PROVIDER_SITE_OTHER): Payer: Medicare Other | Admitting: Adult Health

## 2021-11-22 ENCOUNTER — Encounter: Payer: Self-pay | Admitting: Adult Health

## 2021-11-22 VITALS — BP 132/82 | HR 104 | Ht 65.0 in | Wt 132.0 lb

## 2021-11-22 DIAGNOSIS — G20 Parkinson's disease: Secondary | ICD-10-CM

## 2021-11-29 DIAGNOSIS — U071 COVID-19: Secondary | ICD-10-CM | POA: Diagnosis not present

## 2021-12-18 ENCOUNTER — Other Ambulatory Visit: Payer: Self-pay | Admitting: Adult Health

## 2021-12-18 DIAGNOSIS — G20 Parkinson's disease: Secondary | ICD-10-CM

## 2021-12-20 ENCOUNTER — Other Ambulatory Visit: Payer: Self-pay | Admitting: Adult Health

## 2021-12-20 DIAGNOSIS — G20 Parkinson's disease: Secondary | ICD-10-CM

## 2021-12-24 ENCOUNTER — Other Ambulatory Visit: Payer: Self-pay | Admitting: Adult Health

## 2022-01-21 ENCOUNTER — Encounter: Payer: Self-pay | Admitting: Adult Health

## 2022-01-22 NOTE — Telephone Encounter (Signed)
yes

## 2022-01-23 ENCOUNTER — Other Ambulatory Visit: Payer: Self-pay | Admitting: *Deleted

## 2022-01-23 MED ORDER — RASAGILINE MESYLATE 1 MG PO TABS: 1.0000 mg | ORAL_TABLET | Freq: Every day | ORAL | 3 refills | Status: DC

## 2022-03-08 DIAGNOSIS — L821 Other seborrheic keratosis: Secondary | ICD-10-CM | POA: Diagnosis not present

## 2022-03-08 DIAGNOSIS — D1801 Hemangioma of skin and subcutaneous tissue: Secondary | ICD-10-CM | POA: Diagnosis not present

## 2022-03-08 DIAGNOSIS — D485 Neoplasm of uncertain behavior of skin: Secondary | ICD-10-CM | POA: Diagnosis not present

## 2022-03-08 DIAGNOSIS — L570 Actinic keratosis: Secondary | ICD-10-CM | POA: Diagnosis not present

## 2022-03-08 DIAGNOSIS — L814 Other melanin hyperpigmentation: Secondary | ICD-10-CM | POA: Diagnosis not present

## 2022-03-08 DIAGNOSIS — Z85828 Personal history of other malignant neoplasm of skin: Secondary | ICD-10-CM | POA: Diagnosis not present

## 2022-03-08 DIAGNOSIS — C44319 Basal cell carcinoma of skin of other parts of face: Secondary | ICD-10-CM | POA: Diagnosis not present

## 2022-03-26 ENCOUNTER — Other Ambulatory Visit: Payer: Self-pay | Admitting: Obstetrics & Gynecology

## 2022-03-26 DIAGNOSIS — M8588 Other specified disorders of bone density and structure, other site: Secondary | ICD-10-CM

## 2022-03-26 DIAGNOSIS — M8580 Other specified disorders of bone density and structure, unspecified site: Secondary | ICD-10-CM

## 2022-04-04 ENCOUNTER — Other Ambulatory Visit (HOSPITAL_BASED_OUTPATIENT_CLINIC_OR_DEPARTMENT_OTHER): Payer: Self-pay | Admitting: Obstetrics & Gynecology

## 2022-04-04 DIAGNOSIS — M8580 Other specified disorders of bone density and structure, unspecified site: Secondary | ICD-10-CM

## 2022-04-04 DIAGNOSIS — M8588 Other specified disorders of bone density and structure, other site: Secondary | ICD-10-CM

## 2022-06-18 ENCOUNTER — Ambulatory Visit (HOSPITAL_BASED_OUTPATIENT_CLINIC_OR_DEPARTMENT_OTHER): Payer: Medicare Other | Admitting: Obstetrics & Gynecology

## 2022-06-19 DIAGNOSIS — G20 Parkinson's disease: Secondary | ICD-10-CM | POA: Diagnosis not present

## 2022-06-28 DIAGNOSIS — H5213 Myopia, bilateral: Secondary | ICD-10-CM | POA: Diagnosis not present

## 2022-06-28 DIAGNOSIS — H2513 Age-related nuclear cataract, bilateral: Secondary | ICD-10-CM | POA: Diagnosis not present

## 2022-09-19 ENCOUNTER — Ambulatory Visit
Admission: RE | Admit: 2022-09-19 | Discharge: 2022-09-19 | Disposition: A | Discharge status: Home / Self Care | Payer: Medicare Other | Source: Ambulatory Visit | Attending: Obstetrics & Gynecology | Admitting: Obstetrics & Gynecology

## 2022-09-19 DIAGNOSIS — M8588 Other specified disorders of bone density and structure, other site: Secondary | ICD-10-CM

## 2022-09-19 DIAGNOSIS — M8580 Other specified disorders of bone density and structure, unspecified site: Secondary | ICD-10-CM

## 2022-09-19 DIAGNOSIS — M8589 Other specified disorders of bone density and structure, multiple sites: Secondary | ICD-10-CM | POA: Diagnosis not present

## 2022-09-19 DIAGNOSIS — Z780 Asymptomatic menopausal state: Secondary | ICD-10-CM | POA: Diagnosis not present

## 2022-09-19 DIAGNOSIS — Z1231 Encounter for screening mammogram for malignant neoplasm of breast: Secondary | ICD-10-CM | POA: Diagnosis not present

## 2022-09-24 ENCOUNTER — Ambulatory Visit (INDEPENDENT_AMBULATORY_CARE_PROVIDER_SITE_OTHER): Payer: Medicare Other | Admitting: Obstetrics & Gynecology

## 2022-09-24 DIAGNOSIS — M8588 Other specified disorders of bone density and structure, other site: Secondary | ICD-10-CM | POA: Diagnosis not present

## 2022-09-24 DIAGNOSIS — L900 Lichen sclerosus et atrophicus: Secondary | ICD-10-CM

## 2022-09-24 DIAGNOSIS — Z780 Asymptomatic menopausal state: Secondary | ICD-10-CM | POA: Diagnosis not present

## 2022-09-24 MED ORDER — MOMETASONE FUROATE 0.1 % EX OINT: TOPICAL_OINTMENT | CUTANEOUS | 1 refills | Status: DC

## 2022-09-24 MED ORDER — NONFORMULARY OR COMPOUNDED ITEM: 1 refills | Status: DC

## 2022-09-24 MED ORDER — ESTRADIOL 0.1 MG/GM VA CREA: TOPICAL_CREAM | VAGINAL | 2 refills | Status: DC

## 2022-09-24 NOTE — Progress Notes (Signed)
GYNECOLOGY  VISIT  CC:   discuss BMD, medication refill  HPI: 71 y.o. G3P3 Married White or Caucasian female here for discussion of BMD and for medication refills.  BMD done 09/19/2022 showing osteopenia in left femur and AP spine.  This is minimally changed since prior imaging in 2019.  FRAX scoring done as well.  10 year fracture risk for major osteoporotic fracture is 13.4% and hip fracture is 3.5%.  This second score does make her high risk.  Pros/cons of treatment considering only major thing to have changed with BMD is her age all discussed.  We decided together to continue monitoring this.  Given Parkinson's, fall prevention discussed in particular.  Pt does use small amt of topical vaginal estrogen cream twice weekly and topical testosterone.  Desires to continue both.  Denies VB.  Last MMG was 09/21/2022 and was normal.  Last pap was 2022 and last colonoscopy was 2019.    Lastly, pt has lichen sclerosus and uses mometasone topically for maintenance.  Denies any current itching.  Needs RF for this as well.  Past Medical History:  Diagnosis Date   Basal cell carcinoma    Drowsiness    excessive daytime   Hx of migraines    Hypersomnia 08/26/2013   Hypothyroidism    Hypothyroidism 10/21/2014   Narcolepsy    Parkinson disease     MEDS:   Current Outpatient Medications on File Prior to Visit  Medication Sig Dispense Refill   carbidopa-levodopa (SINEMET IR) 25-100 MG tablet TAKE 1 TABLET BY MOUTH THREE TIMES DAILY 270 tablet 3   Cholecalciferol (D3 HIGH POTENCY) 50 MCG (2000 UT) CAPS      clobetasol ointment (TEMOVATE) 0.05 % Apply bid for up to 7 days for itching 45 g 0   Cyanocobalamin (B-12) 1000 MCG CAPS      Lactase (LACTAID PO) Take by mouth as needed.     levothyroxine (SYNTHROID) 88 MCG tablet Take 88 mcg by mouth daily before breakfast.     rasagiline (AZILECT) 1 MG TABS tablet Take 1 tablet (1 mg total) by mouth daily. 90 tablet 3   SUMAtriptan (IMITREX) 100 MG tablet 100  mg as needed.     vitamin C (ASCORBIC ACID) 500 MG tablet Take by mouth.     No current facility-administered medications on file prior to visit.    ALLERGIES: Ciprofloxacin and Codeine  SH:  married, non smoker  Review of Systems  Constitutional: Negative.     PHYSICAL EXAMINATION:    LMP 07/03/2008     General appearance: alert, cooperative and appears stated age No exam performed   Assessment/Plan: 1. Osteopenia of lumbar spine - will not start medication now and will plan to repeat BMD 3-4 years  2. Lichen sclerosus - mometasone (ELOCON) 0.1 % ointment; Apply topically no more than twice weekly  Dispense: 45 g; Refill: 1  3. Postmenopausal - estradiol (ESTRACE) 0.1 MG/GM vaginal cream; 1 gram vaginally up two twice weekly.  Dispense: 42.5 g; Refill: 2 - NONFORMULARY OR COMPOUNDED ITEM; Topical testosterone cream 1mg / 0.38ml.  Apply 0.55ml topically two times weekly. #101month supply/1RF  Dispense: 1 each; Refill: 1

## 2022-09-26 DIAGNOSIS — Z79899 Other long term (current) drug therapy: Secondary | ICD-10-CM | POA: Diagnosis not present

## 2022-09-26 DIAGNOSIS — G20B2 Parkinson's disease with dyskinesia, with fluctuations: Secondary | ICD-10-CM | POA: Diagnosis not present

## 2022-09-26 DIAGNOSIS — E538 Deficiency of other specified B group vitamins: Secondary | ICD-10-CM | POA: Diagnosis not present

## 2022-09-26 DIAGNOSIS — M25561 Pain in right knee: Secondary | ICD-10-CM | POA: Diagnosis not present

## 2022-09-26 DIAGNOSIS — Z0000 Encounter for general adult medical examination without abnormal findings: Secondary | ICD-10-CM | POA: Diagnosis not present

## 2022-09-26 DIAGNOSIS — E559 Vitamin D deficiency, unspecified: Secondary | ICD-10-CM | POA: Diagnosis not present

## 2022-09-26 DIAGNOSIS — Z8639 Personal history of other endocrine, nutritional and metabolic disease: Secondary | ICD-10-CM | POA: Diagnosis not present

## 2022-09-26 DIAGNOSIS — Z1331 Encounter for screening for depression: Secondary | ICD-10-CM | POA: Diagnosis not present

## 2022-09-26 DIAGNOSIS — G8929 Other chronic pain: Secondary | ICD-10-CM | POA: Diagnosis not present

## 2022-09-26 DIAGNOSIS — E7800 Pure hypercholesterolemia, unspecified: Secondary | ICD-10-CM | POA: Diagnosis not present

## 2022-09-26 DIAGNOSIS — E039 Hypothyroidism, unspecified: Secondary | ICD-10-CM | POA: Diagnosis not present

## 2022-09-26 DIAGNOSIS — M8580 Other specified disorders of bone density and structure, unspecified site: Secondary | ICD-10-CM | POA: Diagnosis not present

## 2022-09-27 ENCOUNTER — Encounter (HOSPITAL_BASED_OUTPATIENT_CLINIC_OR_DEPARTMENT_OTHER): Payer: Self-pay | Admitting: Obstetrics & Gynecology

## 2022-09-27 DIAGNOSIS — Z780 Asymptomatic menopausal state: Secondary | ICD-10-CM | POA: Insufficient documentation

## 2022-09-27 DIAGNOSIS — M8588 Other specified disorders of bone density and structure, other site: Secondary | ICD-10-CM | POA: Insufficient documentation

## 2022-10-22 DIAGNOSIS — M25561 Pain in right knee: Secondary | ICD-10-CM | POA: Diagnosis not present

## 2022-11-22 DIAGNOSIS — C44329 Squamous cell carcinoma of skin of other parts of face: Secondary | ICD-10-CM | POA: Diagnosis not present

## 2022-11-22 DIAGNOSIS — L820 Inflamed seborrheic keratosis: Secondary | ICD-10-CM | POA: Diagnosis not present

## 2022-11-22 DIAGNOSIS — Z85828 Personal history of other malignant neoplasm of skin: Secondary | ICD-10-CM | POA: Diagnosis not present

## 2022-11-27 DIAGNOSIS — H2513 Age-related nuclear cataract, bilateral: Secondary | ICD-10-CM | POA: Diagnosis not present

## 2022-12-11 DIAGNOSIS — Z85828 Personal history of other malignant neoplasm of skin: Secondary | ICD-10-CM | POA: Diagnosis not present

## 2022-12-11 DIAGNOSIS — C44329 Squamous cell carcinoma of skin of other parts of face: Secondary | ICD-10-CM | POA: Diagnosis not present

## 2022-12-15 ENCOUNTER — Other Ambulatory Visit: Payer: Self-pay | Admitting: Adult Health

## 2022-12-15 DIAGNOSIS — G20C Parkinsonism, unspecified: Secondary | ICD-10-CM

## 2022-12-19 ENCOUNTER — Telehealth: Payer: Self-pay | Admitting: Adult Health

## 2022-12-19 NOTE — Telephone Encounter (Signed)
Pt is calling. Wanting to know when her medication  Carbidopa-Levodopa 25-100 MG will be sent to pharmacy.

## 2022-12-19 NOTE — Telephone Encounter (Signed)
Refill sent to pharmacy. Pt needs an appt.

## 2022-12-21 ENCOUNTER — Encounter: Payer: Self-pay | Admitting: Adult Health

## 2022-12-24 NOTE — Telephone Encounter (Signed)
Spoke with patient made sooner appointment for 12/25/2021  Pt thanked me for calling

## 2022-12-25 ENCOUNTER — Encounter: Payer: Self-pay | Admitting: Adult Health

## 2022-12-25 ENCOUNTER — Ambulatory Visit (INDEPENDENT_AMBULATORY_CARE_PROVIDER_SITE_OTHER): Payer: Medicare Other | Admitting: Adult Health

## 2022-12-25 VITALS — BP 164/100 | HR 75 | Ht 65.5 in | Wt 129.0 lb

## 2022-12-25 DIAGNOSIS — G20A1 Parkinson's disease without dyskinesia, without mention of fluctuations: Secondary | ICD-10-CM

## 2022-12-25 NOTE — Patient Instructions (Signed)
Your Plan:  Continue Sinemet and azliect If your symptoms worsen or you develop new symptoms please let us know.       Thank you for coming to see [place] at [facility]. I hope we have been able to provide you high quality care today.  You may receive a patient satisfaction survey over the next few weeks. We would appreciate your feedback and comments so that we may continue to improve ourselves and the health of our patients.

## 2022-12-25 NOTE — Progress Notes (Signed)
PATIENT: Sarah Montoya DOB: September 02, 1951  REASON FOR VISIT: follow up HISTORY FROM: patient  Chief Complaint  Patient presents with   Follow-up    Rm 5, alone. Stable.  (Alternates with [place] with Dr. Linus Mako).  Need to repeat Bp after your done.       HISTORY OF PRESENT ILLNESS: Today 12/25/22:  Sarah Montoya is a 72 y.o. female with a history of parkinson's disease. Returns today for follow-up. Overall she is doing well. Continues on Sinemet 25-100 mg TID and Azliect 1 mg daily. No changes in gait or balance. No issues with tremor. Denies any trouble chewing or swallowing food. Able to complete all ADLS independently. Alternates visits with our office and Dr. Linus Mako.      11/22/21: Sarah Montoya is a 72 year old female with a history of Parkinson disease.  She returns today for follow-up.  Overall she feels that her symptoms have remained stable.  She continues on Sinemet 3 times a day and Azilect 1 mg daily.  In the past we were thinking of switching her to selegiline due to cost the patient is now able to get Azilect through good Rx at a good price.  She denies any changes with her gait or balance.  Denies any trouble sleeping.  Able to complete all ADLs independently.  Reports good appetite.  No trouble chewing or swallowing food.  Denies tremor.  Returns today for an evaluation.  08/09/21: Sarah Montoya is a 72 year old female with a history of Parkinson's disease.  She returns today for follow-up.  She did see Dr. Deboraha Sprang in March of this year.  At that time she was doing well.  She feels that her symptoms have remained relatively stable.  Denies a significant tremor.  Denies any significant changes with her gait or balance.  Denies any trouble chewing or swallowing food.  She does report that rasagiline is quite expensive.  According to Dr. Liane Comber note he was okay with switching her to selegiline 5 mg twice a day.  She continues on Sinemet 1 tablet 3 times a day.  She also uses Adderall  reports that she typically takes 5 mg occasionally in the afternoons as that is when her fatigue is the worst.  She does try to take a 20-minute nap daily.  Reports that she tries to stay well-hydrated.   REVIEW OF SYSTEMS: Out of a complete 14 system review of symptoms, the patient complains only of the following symptoms, and all other reviewed systems are negative.  See HPI  ALLERGIES: Allergies  Allergen Reactions   Ciprofloxacin Diarrhea    Upset stomach   Codeine Nausea Only    HOME MEDICATIONS: Outpatient Medications Prior to Visit  Medication Sig Dispense Refill   carbidopa-levodopa (SINEMET IR) 25-100 MG tablet Take 1 tablet by mouth 3 (three) times daily. Must be seen for further refills. Call [phone] to schedule. 90 tablet 0   Cholecalciferol (D3 HIGH POTENCY) 50 MCG (2000 UT) CAPS      clobetasol ointment (TEMOVATE) 0.05 % Apply bid for up to 7 days for itching 45 g 0   Cyanocobalamin (B-12) 1000 MCG CAPS      estradiol (ESTRACE) 0.1 MG/GM vaginal cream 1 gram vaginally up two twice weekly. 42.5 g 2   Lactase (LACTAID PO) Take by mouth as needed.     levothyroxine (SYNTHROID) 88 MCG tablet Take 88 mcg by mouth daily before breakfast.     mometasone (ELOCON) 0.1 % ointment Apply topically no more than twice  weekly 45 g 1   NONFORMULARY OR COMPOUNDED ITEM Topical testosterone cream '1mg'$ / 0.331m.  Apply 0.199mtopically two times weekly. #31m26monthpply/1RF 1 each 1   rasagiline (AZILECT) 1 MG TABS tablet Take 1 tablet (1 mg total) by mouth daily. 90 tablet 3   SUMAtriptan (IMITREX) 100 MG tablet 100 mg as needed.     vitamin C (ASCORBIC ACID) 500 MG tablet Take by mouth.     No facility-administered medications prior to visit.    PAST MEDICAL HISTORY: Past Medical History:  Diagnosis Date   Basal cell carcinoma    Drowsiness    excessive daytime   Hx of migraines    Hypersomnia 08/26/2013   Hypothyroidism    Hypothyroidism 10/21/2014   Narcolepsy    Parkinson  disease     PAST SURGICAL HISTORY: Past Surgical History:  Procedure Laterality Date   MENISCUS REPAIR Right 05/2014    FAMILY HISTORY: Family History  Problem Relation Age of Onset   Breast cancer Mother 54 80Osteoporosis Mother    Prostate cancer Father    Thyroid disease Father    Mental retardation Sister     SOCIAL HISTORY: Social History   Socioeconomic History   Marital status: Married    Spouse name: Not on file   Number of children: 3   Years of education: college   Highest education level: Not on file  Occupational History   Occupation: insArt therapist Comment: [employer], [employer] Use   Smoking status: Never   Smokeless tobacco: Never  Vaping Use   Vaping Use: Never used  Substance and Sexual Activity   Alcohol use: Not Currently    Alcohol/week: 0.0 standard drinks of alcohol   Drug use: No   Sexual activity: Yes    Partners: Male    Birth control/protection: Post-menopausal  Other Topics Concern   Not on file  Social History Narrative   Lives with husband   Right handed   Caffeine: 1 cup/day      See previous notes.   Social Determinants of Health   Financial Resource Strain: Not on file  Food Insecurity: Not on file  Transportation Needs: Not on file  Physical Activity: Not on file  Stress: Not on file  Social Connections: Not on file  Intimate Partner Violence: Not on file      PHYSICAL EXAM  Vitals:   12/25/22 1122 12/25/22 1158  BP: (!) 159/98 (!) 164/100  Pulse: 82 75  SpO2:  99%  Weight: 129 lb (58.5 kg)   Height: 5' 5.5" (1.664 m)     Body mass index is 21.14 kg/m.  Generalized: Well developed, in no acute distress   Neurological examination  Mentation: Alert oriented to time, place, history taking. Follows all commands speech and language fluent Cranial nerve II-XII: Pupils were equal round reactive to light. Extraocular movements were full, visual field were full on confrontational test. Facial sensation and  strength were normal. Uvula tongue midline. Head turning and shoulder shrug  were normal and symmetric. Motor: The motor testing reveals 5 over 5 strength of all 4 extremities.  Mild impairment of finger taps bilaterally.  Good toe and heel taps bilaterally. Sensory: Sensory testing is intact to soft touch on all 4 extremities. No evidence of extinction is noted.  Coordination: Cerebellar testing reveals good finger-nose-finger and heel-to-shin bilaterally.  Gait and station: Patient able to stand without assistance.  Good stride.  Accentuates arm swing bilaterally Reflexes: Deep tendon reflexes  are symmetric and normal bilaterally.   DIAGNOSTIC DATA (LABS, IMAGING, TESTING) - I reviewed patient records, labs, notes, testing and imaging myself where available.  Lab Results  Component Value Date   HGB 13.9 09/21/2014      Component Value Date/Time   NA 139 09/21/2014 1554   K 4.2 09/21/2014 1554   CL 103 09/21/2014 1554   CO2 26 09/21/2014 1554   GLUCOSE 98 09/21/2014 1554   BUN 15 09/21/2014 1554   CREATININE 0.71 09/21/2014 1554   CALCIUM 9.6 09/21/2014 1554   PROT 7.0 09/21/2014 1554   ALBUMIN 4.5 09/21/2014 1554   AST 22 09/21/2014 1554   ALT 20 09/21/2014 1554   ALKPHOS 58 09/21/2014 1554   BILITOT 0.4 09/21/2014 1554   Lab Results  Component Value Date   CHOL 191 09/21/2014   HDL 71 09/21/2014   LDLCALC 98 09/21/2014   TRIG 108 09/21/2014   CHOLHDL 2.7 09/21/2014   No results found for: "HGBA1C" No results found for: "VITAMINB12" Lab Results  Component Value Date   TSH 1.603 06/30/2013      ASSESSMENT AND PLAN 72 y.o. year old female  has a past medical history of Basal cell carcinoma, Drowsiness, migraines, Hypersomnia (08/26/2013), Hypothyroidism, Hypothyroidism (10/21/2014), Narcolepsy, and Parkinson disease. here with:  Parkinson's disease  Continue Sinemet 25-100 mg 3 times a day Continue rasagiline 1 mg daily Advised to follow-up in 1 year or sooner if  needed Patient also sees Dr. Deboraha Sprang at [facility]  She alternates visits every 6 months     Ward Givens, MSN, NP-C 12/25/2022, 11:29 AM [facility] [address] [place], [place] [address] [phone]

## 2023-01-02 DIAGNOSIS — H269 Unspecified cataract: Secondary | ICD-10-CM | POA: Diagnosis not present

## 2023-01-02 DIAGNOSIS — H2511 Age-related nuclear cataract, right eye: Secondary | ICD-10-CM | POA: Diagnosis not present

## 2023-01-08 DIAGNOSIS — M25561 Pain in right knee: Secondary | ICD-10-CM | POA: Diagnosis not present

## 2023-01-14 ENCOUNTER — Other Ambulatory Visit: Payer: Self-pay | Admitting: Adult Health

## 2023-01-14 DIAGNOSIS — G20C Parkinsonism, unspecified: Secondary | ICD-10-CM

## 2023-01-16 ENCOUNTER — Other Ambulatory Visit: Payer: Self-pay | Admitting: Adult Health

## 2023-01-16 DIAGNOSIS — G20C Parkinsonism, unspecified: Secondary | ICD-10-CM

## 2023-01-16 DIAGNOSIS — H2512 Age-related nuclear cataract, left eye: Secondary | ICD-10-CM | POA: Diagnosis not present

## 2023-01-16 DIAGNOSIS — H269 Unspecified cataract: Secondary | ICD-10-CM | POA: Diagnosis not present

## 2023-01-16 MED ORDER — CARBIDOPA-LEVODOPA 25-100 MG PO TABS: 1.0000 | ORAL_TABLET | Freq: Three times a day (TID) | ORAL | 3 refills | Status: DC

## 2023-01-20 ENCOUNTER — Other Ambulatory Visit: Payer: Self-pay | Admitting: Adult Health

## 2023-02-10 ENCOUNTER — Encounter (HOSPITAL_BASED_OUTPATIENT_CLINIC_OR_DEPARTMENT_OTHER): Payer: Self-pay | Admitting: Obstetrics & Gynecology

## 2023-04-23 DIAGNOSIS — M1711 Unilateral primary osteoarthritis, right knee: Secondary | ICD-10-CM | POA: Diagnosis not present

## 2023-05-02 ENCOUNTER — Ambulatory Visit: Payer: Medicare Other | Admitting: Adult Health

## 2023-05-02 DIAGNOSIS — M1711 Unilateral primary osteoarthritis, right knee: Secondary | ICD-10-CM | POA: Diagnosis not present

## 2023-05-07 DIAGNOSIS — Z85828 Personal history of other malignant neoplasm of skin: Secondary | ICD-10-CM | POA: Diagnosis not present

## 2023-05-07 DIAGNOSIS — D1801 Hemangioma of skin and subcutaneous tissue: Secondary | ICD-10-CM | POA: Diagnosis not present

## 2023-05-07 DIAGNOSIS — L821 Other seborrheic keratosis: Secondary | ICD-10-CM | POA: Diagnosis not present

## 2023-05-07 DIAGNOSIS — L578 Other skin changes due to chronic exposure to nonionizing radiation: Secondary | ICD-10-CM | POA: Diagnosis not present

## 2023-05-07 DIAGNOSIS — L820 Inflamed seborrheic keratosis: Secondary | ICD-10-CM | POA: Diagnosis not present

## 2023-05-07 DIAGNOSIS — L570 Actinic keratosis: Secondary | ICD-10-CM | POA: Diagnosis not present

## 2023-05-09 DIAGNOSIS — M1711 Unilateral primary osteoarthritis, right knee: Secondary | ICD-10-CM | POA: Diagnosis not present

## 2023-05-09 DIAGNOSIS — M25551 Pain in right hip: Secondary | ICD-10-CM | POA: Diagnosis not present

## 2023-05-15 DIAGNOSIS — M1611 Unilateral primary osteoarthritis, right hip: Secondary | ICD-10-CM | POA: Diagnosis not present

## 2023-08-06 DIAGNOSIS — M1711 Unilateral primary osteoarthritis, right knee: Secondary | ICD-10-CM | POA: Diagnosis not present

## 2023-08-08 DIAGNOSIS — Z23 Encounter for immunization: Secondary | ICD-10-CM | POA: Diagnosis not present

## 2023-08-09 DIAGNOSIS — M25561 Pain in right knee: Secondary | ICD-10-CM | POA: Diagnosis not present

## 2023-08-26 DIAGNOSIS — G20A1 Parkinson's disease without dyskinesia, without mention of fluctuations: Secondary | ICD-10-CM | POA: Diagnosis not present

## 2023-09-12 DIAGNOSIS — M25561 Pain in right knee: Secondary | ICD-10-CM | POA: Diagnosis not present

## 2023-09-19 DIAGNOSIS — L821 Other seborrheic keratosis: Secondary | ICD-10-CM | POA: Diagnosis not present

## 2023-09-19 DIAGNOSIS — L578 Other skin changes due to chronic exposure to nonionizing radiation: Secondary | ICD-10-CM | POA: Diagnosis not present

## 2023-09-19 DIAGNOSIS — C44311 Basal cell carcinoma of skin of nose: Secondary | ICD-10-CM | POA: Diagnosis not present

## 2023-09-19 DIAGNOSIS — Z85828 Personal history of other malignant neoplasm of skin: Secondary | ICD-10-CM | POA: Diagnosis not present

## 2023-10-02 ENCOUNTER — Other Ambulatory Visit (INDEPENDENT_AMBULATORY_CARE_PROVIDER_SITE_OTHER): Payer: Medicare Other

## 2023-10-02 ENCOUNTER — Ambulatory Visit (INDEPENDENT_AMBULATORY_CARE_PROVIDER_SITE_OTHER): Payer: Medicare Other | Admitting: Orthopaedic Surgery

## 2023-10-02 DIAGNOSIS — M25551 Pain in right hip: Secondary | ICD-10-CM | POA: Diagnosis not present

## 2023-10-02 NOTE — Progress Notes (Signed)
The patient is a 72 year old female that I am seeing as a new patient today.  However I have operated on her husband before with a hip replacement.  She does have Parkinson disease.  She has been dealing with right knee pain for some time now and does have known valgus malalignment of that knee.  About 3 to 4 years ago she had arthroscopic surgery by Dr. Madelon Lips on her right knee and this was meniscal type of surgery.  She deals more with right knee pain and a recent MRI of her right knee showed a complex lateral meniscal tear but also areas of full-thickness cartilage loss of the patellofemoral joint and the lateral compartment of her knee.  She is also been having right hip pain.  I was able to review all of her past medical history and medications within epic.  At this point both areas in the right side hurt on a daily basis and they are detrimentally fighting her mobility, her quality of life and her actives daily living.  When I do see her ambulate she is definitely favoring her right side with her right hip and her right knee.  Her right knee does have valgus malalignment.  When I have her lay in a supine position I am surprised that it is not seem to be a significant leg length difference.  Her right knee does have valgus malalignment with lateral joint line tenderness.  She does get occasional stabbing pain in that right knee but it does hurt on a daily basis with weightbearing.  She denies any severe pain with her right hip but I believe a lot of the pain from her hip is deferred to the knee.  Her right hip has significant limitations with rotation and is very stiff.  An AP pelvis and lateral the right hip shows severe and severe arthritis of the right hip with complete loss of joint space.  There is flattening of the femoral head and sclerotic changes as well as large osteophytes around the right hip.  The left hip joint space is well-maintained and appears normal.  I did review the MRI of her right  knee as well showing the extent of her arthritis and complex lateral meniscal tear.  I do feel eventually she is going to need both a right hip replacement and a right knee replacement.  I explained this thoroughly and we had a long discussion about hip replacement surgery and knee replacement surgery as well as the risk and benefits of both of these.  I agree with her proceeding with a right hip replacement first given that I think that is the more severe disease that she is experiencing and this would radiate down to her knee as well.  I do feel that she will still need a knee replacement at some point as well.  All questions concerns were answered addressed.  She wants to be scheduled first for her right hip replacement and I agree with this as well.

## 2023-10-03 NOTE — Therapy (Signed)
OUTPATIENT PHYSICAL THERAPY NEURO EVALUATION   Patient Name: Sarah Montoya MRN: [id number] DOB:[birth year], 72 y.o., female Today's Date: 10/07/2023   PCP: Marden Noble, MD  REFERRING PROVIDER: Jaquita Folds, MD  END OF SESSION:  PT End of Session - 10/07/23 1023     Visit Number 1    Number of Visits 9    Date for PT Re-Evaluation 11/04/23    Authorization Type Medicare/USAA    PT Start Time 0939   pt late   PT Stop Time 1019    PT Time Calculation (min) 40 min    Equipment Utilized During Treatment Gait belt    Activity Tolerance Patient tolerated treatment well    Behavior During Therapy [facility] for tasks assessed/performed             Past Medical History:  Diagnosis Date   Basal cell carcinoma    Drowsiness    excessive daytime   Hx of migraines    Hypersomnia 08/26/2013   Hypothyroidism    Hypothyroidism 10/21/2014   Narcolepsy    Parkinson disease (HCC)    Past Surgical History:  Procedure Laterality Date   MENISCUS REPAIR Right 05/2014   Patient Active Problem List   Diagnosis Date Noted   Postmenopausal 09/27/2022   Osteopenia of lumbar spine 09/27/2022   Medication monitoring encounter 04/01/2018   Parkinson's disease (HCC) 04/01/2018   Muscle tone increased 06/12/2017   Bradykinesia 06/12/2017   Dysphonia 06/12/2017   Raynaud's phenomenon without gangrene 06/12/2017   Tarsal tunnel syndrome 02/21/2017   Hypothyroidism 10/21/2014   Hypersomnia, persistent 10/21/2014   Injury of meniscus of right knee 07/22/2014   Subacromial bursitis 02/04/2013   Abnormality of gait 08/13/2012   Enthesopathy of ankle and tarsus 10/04/2009    ONSET DATE: 6-8 years   REFERRING DIAG: G20.A1 (ICD-10-CM) - Parkinson's disease without dyskinesia, without mention of fluctuations  THERAPY DIAG:  Abnormal posture  Unsteadiness on feet  Rationale for Evaluation and Treatment: Rehabilitation  SUBJECTIVE:                                                                                                                                                                                              SUBJECTIVE STATEMENT: Patient reports having had PD for 6-8 years. This is the first time she is having therapy for it. Reports that she has not had trouble with balance but knows it is important and does not want to develop any problems. Denies freezing episodes or falls. Reports that she needs a R hip replacement- this has not yet been scheduled. Wearing a knee brace because the problem with her  hip is making her knees hurt.   Pt accompanied by: self  PERTINENT HISTORY: Migraines, hypersomnia, hypothyroidism, PD, R meniscus repair 2015  PAIN:  Are you having pain? No  PRECAUTIONS: None  RED FLAGS: None   WEIGHT BEARING RESTRICTIONS: No  FALLS: Has patient fallen in last 6 months? No  LIVING ENVIRONMENT: Lives with: lives with their spouse Lives in: House/apartment Stairs:  10 steps to enter; 2nd story home  Has following equipment at home: Dan Humphreys - 2 wheeled and Tour manager  PLOF: Independent; retired; enjoys swimming, sees a Education officer, environmental, walking   PATIENT GOALS: prevent problems with my balance   OBJECTIVE:  Note: Objective measures were completed at Evaluation unless otherwise noted.  DIAGNOSTIC FINDINGS: none recent   COGNITION: Overall cognitive status: Within functional limits for tasks assessed   SENSATION: Denies N/T  COORDINATION: Alternating pronation/supination: WNL B Alternating toe tap: WNL B Finger to nose: WNL B   MUSCLE TONE: WNL B LEs   POSTURE: rounded shoulders and forward head; head flexed quite a bit at rest and standing in slightly crouched posture with knees bent; very slight dyskinesias evident occasionally   LOWER EXTREMITY ROM:     Active  Right Eval Left Eval  Hip flexion    Hip extension    Hip abduction    Hip adduction    Hip internal rotation    Hip external rotation    Knee flexion     Knee extension    Ankle dorsiflexion 18 9  Ankle plantarflexion    Ankle inversion    Ankle eversion     (Blank rows = not tested)  LOWER EXTREMITY MMT:    MMT (in sitting) Right Eval Left Eval  Hip flexion 4 4  Hip extension    Hip abduction 4+ 4+  Hip adduction 4+ 4+  Hip internal rotation    Hip external rotation    Knee flexion 5 *crepitus without pain 5  Knee extension 5 *crepitus without pain 5  Ankle dorsiflexion 4 4  Ankle plantarflexion 4 4  Ankle inversion    Ankle eversion    (Blank rows = not tested)  GAIT: Gait pattern: Antalgic gait pattern with large arm swing with reduced gait speed; occasional imbalance with turns Assistive device utilized: None Level of assistance: Modified independence   FUNCTIONAL TESTS:   [facility] PT Assessment - 10/07/23 0001       Standardized Balance Assessment   Standardized Balance Assessment Mini-BESTest;Timed Up and Go Test      Mini-BESTest   Sit To Stand Normal: Comes to stand without use of hands and stabilizes independently.    Rise to Toes Normal: Stable for 3 s with maximum height.    Stand on one leg (left) Normal: 20 s.    Stand on one leg (right) Normal: 20 s.    Stand on one leg - lowest score 2    Compensatory Stepping Correction - Forward Normal: Recovers independently with a single, large step (second realignement is allowed).    Compensatory Stepping Correction - Backward No step, OR would fall if not caught, OR falls spontaneously.   uses reaching strategy   Compensatory Stepping Correction - Left Lateral Normal: Recovers independently with 1 step (crossover or lateral OK)    Compensatory Stepping Correction - Right Lateral Normal: Recovers independently with 1 step (crossover or lateral OK)    Stepping Corredtion Lateral - lowest score 2    Stance - Feet together, eyes open, firm surface  Normal: 30s    Stance - Feet together, eyes closed, foam surface  Normal: 30s    Incline - Eyes Closed Normal: Stands  independently 30s and aligns with gravity    Change in Gait Speed Severe: Unable to achieve significant change in walking speed AND signs of imbalance.    Walk with head turns - Horizontal Normal: performs head turns with no change in gait speed and good balance    Walk with pivot turns Moderate:Turns with feet close SLOW (>4 steps) with good balance.    Step over obstacles Moderate: Steps over box but touches box OR displays cautious behavior by slowing gait.    Timed UP & GO with Dual Task Moderate: Dual Task affects either counting OR walking (>10%) when compared to the TUG without Dual Task.    Mini-BEST total score 21      Timed Up and Go Test   Normal TUG (seconds) 12.97    Manual TUG (seconds) 12.03    Cognitive TUG (seconds) 17.07    TUG Comments required reminder to sit down after each trial, which took increased time; <10% increase on TUG manual, 31.6% increase on TUG cog                TODAY'S TREATMENT:                                                                                                                              DATE: 10/07/23    PATIENT EDUCATION: Education details: prognosis, POC, edu on exam findings Person educated: Patient Education method: Medical illustrator Education comprehension: verbalized understanding  HOME EXERCISE PROGRAM: Not initiated   GOALS: Goals reviewed with patient? Yes  SHORT TERM GOALS: Target date: 10/21/2023  Patient to be independent with initial HEP. Baseline: HEP initiated Goal status: INITIAL    LONG TERM GOALS: Target date: 11/04/2023  Patient to be independent with advanced HEP. Baseline: Not yet initiated  Goal status: INITIAL  Patient to verbalize understanding of local Parkinson's Disease community resources including community fitness post D/C.   Baseline: Not yet initiated  Goal status: INITIAL  Patient to demonstrate need for only 1 step to recover posterior LOB with push and release  test.  Baseline: requires reaching strategy  Goal status: INITIAL  Patient to demonstrate postural correction at rest and with activity for improved awareness.  Baseline:  rounded shoulders and forward head; head flexed quite a bit at rest and standing in slightly crouched posture with knees bent; very slight dyskinesias evident occasionally  Goal status: INITIAL  Patient to complete TUG Cognitive in <10% difference compared to TUG in order to improve ability to dual task.  Baseline: 31.6% Goal status: INITIAL      ASSESSMENT:  CLINICAL IMPRESSION:  Patient is a 72 y/o F presenting to OPPT with report of Parkinson's for the past 6-8 years. Denies falls or freezing episodes but is requesting assessment to look at her balance.  Of note, patient reports R knee and hip pain and reports that she needs a hip replacement surgery. Patient today presenting with Postural changes, B hip flexor weakness, imbalance and gait deviations with gait and gait with balance challenges, difficulty with dual tasking, and reduced use of stepping strategy with posterior LOB. Would benefit from skilled PT services 1-2 x/week for 4 weeks to address aforementioned impairments in order to optimize level of function.    OBJECTIVE IMPAIRMENTS: Abnormal gait, decreased balance, difficulty walking, decreased strength, impaired flexibility, improper body mechanics, postural dysfunction, and pain.   ACTIVITY LIMITATIONS: carrying, lifting, bending, standing, stairs, transfers, reach over head, and locomotion level  PARTICIPATION LIMITATIONS: meal prep, cleaning, laundry, shopping, community activity, yard work, and church  PERSONAL FACTORS: Age, Past/current experiences, Time since onset of injury/illness/exacerbation, and 3+ comorbidities: Migraines, hypersomnia, hypothyroidism, PD, R meniscus repair 2015  are also affecting patient's functional outcome.   REHAB POTENTIAL: Good  CLINICAL DECISION MAKING:  Evolving/moderate complexity  EVALUATION COMPLEXITY: Moderate  PLAN:  PT FREQUENCY: 1-2x/week  PT DURATION: 4 weeks  PLANNED INTERVENTIONS: 97164- PT Re-evaluation, 97110-Therapeutic exercises, 97530- Therapeutic activity, 97112- Neuromuscular re-education, 97535- Self Care, 16109- Manual therapy, [phone]- Gait training, [phone]- Canalith repositioning, 97014- Electrical stimulation (unattended), Patient/Family education, Balance training, Stair training, Taping, Dry Needling, Joint mobilization, Spinal mobilization, Vestibular training, DME instructions, Cryotherapy, and Moist heat  PLAN FOR NEXT SESSION: edu on PD community resources, balance reactions backwards, stretching hip flexors, strengthening cervical extensors to address posture; initiate HEP     Anette Guarneri, PT, DPT 10/07/23 10:35 AM  [facility] at [facility] [address], [address] [place], [place] [address] Phone # [phone] Fax # [phone]

## 2023-10-07 ENCOUNTER — Ambulatory Visit: Payer: Medicare Other | Attending: Neurology | Admitting: Physical Therapy

## 2023-10-07 ENCOUNTER — Encounter: Payer: Self-pay | Admitting: Physical Therapy

## 2023-10-07 ENCOUNTER — Other Ambulatory Visit: Payer: Self-pay

## 2023-10-07 DIAGNOSIS — R2681 Unsteadiness on feet: Secondary | ICD-10-CM | POA: Diagnosis not present

## 2023-10-07 DIAGNOSIS — R293 Abnormal posture: Secondary | ICD-10-CM | POA: Insufficient documentation

## 2023-10-07 DIAGNOSIS — M6281 Muscle weakness (generalized): Secondary | ICD-10-CM | POA: Diagnosis not present

## 2023-10-07 DIAGNOSIS — R279 Unspecified lack of coordination: Secondary | ICD-10-CM | POA: Diagnosis not present

## 2023-10-08 NOTE — Therapy (Signed)
OUTPATIENT PHYSICAL THERAPY NEURO TREATMENT   Patient Name: Sarah Montoya MRN: [id number] DOB:1951/03/06, 72 y.o., female Today's Date: 10/08/2023   PCP: Marden Noble, MD  REFERRING PROVIDER: Jaquita Folds, MD  END OF SESSION:    Past Medical History:  Diagnosis Date   Basal cell carcinoma    Drowsiness    excessive daytime   Hx of migraines    Hypersomnia 08/26/2013   Hypothyroidism    Hypothyroidism 10/21/2014   Narcolepsy    Parkinson disease [facility])    Past Surgical History:  Procedure Laterality Date   MENISCUS REPAIR Right 05/2014   Patient Active Problem List   Diagnosis Date Noted   Postmenopausal 09/27/2022   Osteopenia of lumbar spine 09/27/2022   Medication monitoring encounter 04/01/2018   Parkinson's disease (HCC) 04/01/2018   Muscle tone increased 06/12/2017   Bradykinesia 06/12/2017   Dysphonia 06/12/2017   Raynaud's phenomenon without gangrene 06/12/2017   Tarsal tunnel syndrome 02/21/2017   Hypothyroidism 10/21/2014   Hypersomnia, persistent 10/21/2014   Injury of meniscus of right knee 07/22/2014   Subacromial bursitis 02/04/2013   Abnormality of gait 08/13/2012   Enthesopathy of ankle and tarsus 10/04/2009    ONSET DATE: 6-8 years   REFERRING DIAG: G20.A1 (ICD-10-CM) - Parkinson's disease without dyskinesia, without mention of fluctuations  THERAPY DIAG:  No diagnosis found.  Rationale for Evaluation and Treatment: Rehabilitation  SUBJECTIVE:                                                                                                                                                                                             SUBJECTIVE STATEMENT: Patient reports having had PD for 6-8 years. This is the first time she is having therapy for it. Reports that she has not had trouble with balance but knows it is important and does not want to develop any problems. Denies freezing episodes or falls. Reports that she needs a R hip  replacement- this has not yet been scheduled. Wearing a knee brace because the problem with her hip is making her knees hurt.   Pt accompanied by: self  PERTINENT HISTORY: Migraines, hypersomnia, hypothyroidism, PD, R meniscus repair 2015  PAIN:  Are you having pain? No  PRECAUTIONS: None  RED FLAGS: None   WEIGHT BEARING RESTRICTIONS: No  FALLS: Has patient fallen in last 6 months? No  LIVING ENVIRONMENT: Lives with: lives with their spouse Lives in: House/apartment Stairs:  10 steps to enter; 2nd story home  Has following equipment at home: Dan Humphreys - 2 wheeled and Tour manager  PLOF: Independent; retired; enjoys swimming, sees a Education officer, environmental, walking   PATIENT  GOALS: prevent problems with my balance   OBJECTIVE:      TODAY'S TREATMENT: 10/09/23 Activity Comments                        Note: Objective measures were completed at Evaluation unless otherwise noted.  DIAGNOSTIC FINDINGS: none recent   COGNITION: Overall cognitive status: Within functional limits for tasks assessed   SENSATION: Denies N/T  COORDINATION: Alternating pronation/supination: WNL B Alternating toe tap: WNL B Finger to nose: WNL B   MUSCLE TONE: WNL B LEs   POSTURE: rounded shoulders and forward head; head flexed quite a bit at rest and standing in slightly crouched posture with knees bent; very slight dyskinesias evident occasionally   LOWER EXTREMITY ROM:     Active  Right Eval Left Eval  Hip flexion    Hip extension    Hip abduction    Hip adduction    Hip internal rotation    Hip external rotation    Knee flexion    Knee extension    Ankle dorsiflexion 18 9  Ankle plantarflexion    Ankle inversion    Ankle eversion     (Blank rows = not tested)  LOWER EXTREMITY MMT:    MMT (in sitting) Right Eval Left Eval  Hip flexion 4 4  Hip extension    Hip abduction 4+ 4+  Hip adduction 4+ 4+  Hip internal rotation    Hip external rotation    Knee  flexion 5 *crepitus without pain 5  Knee extension 5 *crepitus without pain 5  Ankle dorsiflexion 4 4  Ankle plantarflexion 4 4  Ankle inversion    Ankle eversion    (Blank rows = not tested)  GAIT: Gait pattern: Antalgic gait pattern with large arm swing with reduced gait speed; occasional imbalance with turns Assistive device utilized: None Level of assistance: Modified independence   FUNCTIONAL TESTS:        TODAY'S TREATMENT:                                                                                                                              DATE: 10/07/23    PATIENT EDUCATION: Education details: prognosis, POC, edu on exam findings Person educated: Patient Education method: Medical illustrator Education comprehension: verbalized understanding  HOME EXERCISE PROGRAM: Not initiated   GOALS: Goals reviewed with patient? Yes  SHORT TERM GOALS: Target date: 10/21/2023  Patient to be independent with initial HEP. Baseline: HEP initiated Goal status: IN PROGRESS    LONG TERM GOALS: Target date: 11/04/2023  Patient to be independent with advanced HEP. Baseline: Not yet initiated  Goal status: IN PROGRESS  Patient to verbalize understanding of local Parkinson's Disease community resources including community fitness post D/C.   Baseline: Not yet initiated  Goal status: IN PROGRESS  Patient to demonstrate need for only 1 step to recover posterior LOB with push and release test.  Baseline: requires reaching strategy  Goal status: IN PROGRESS  Patient to demonstrate postural correction at rest and with activity for improved awareness.  Baseline:  rounded shoulders and forward head; head flexed quite a bit at rest and standing in slightly crouched posture with knees bent; very slight dyskinesias evident occasionally  Goal status: IN PROGRESS  Patient to complete TUG Cognitive in <10% difference compared to TUG in order to improve ability to dual  task.  Baseline: 31.6% Goal status: IN PROGRESS      ASSESSMENT:  CLINICAL IMPRESSION:  Patient is a 72 y/o F presenting to OPPT with report of Parkinson's for the past 6-8 years. Denies falls or freezing episodes but is requesting assessment to look at her balance. Of note, patient reports R knee and hip pain and reports that she needs a hip replacement surgery. Patient today presenting with Postural changes, B hip flexor weakness, imbalance and gait deviations with gait and gait with balance challenges, difficulty with dual tasking, and reduced use of stepping strategy with posterior LOB. Would benefit from skilled PT services 1-2 x/week for 4 weeks to address aforementioned impairments in order to optimize level of function.    OBJECTIVE IMPAIRMENTS: Abnormal gait, decreased balance, difficulty walking, decreased strength, impaired flexibility, improper body mechanics, postural dysfunction, and pain.   ACTIVITY LIMITATIONS: carrying, lifting, bending, standing, stairs, transfers, reach over head, and locomotion level  PARTICIPATION LIMITATIONS: meal prep, cleaning, laundry, shopping, community activity, yard work, and church  PERSONAL FACTORS: Age, Past/current experiences, Time since onset of injury/illness/exacerbation, and 3+ comorbidities: Migraines, hypersomnia, hypothyroidism, PD, R meniscus repair 2015  are also affecting patient's functional outcome.   REHAB POTENTIAL: Good  CLINICAL DECISION MAKING: Evolving/moderate complexity  EVALUATION COMPLEXITY: Moderate  PLAN:  PT FREQUENCY: 1-2x/week  PT DURATION: 4 weeks  PLANNED INTERVENTIONS: 97164- PT Re-evaluation, 97110-Therapeutic exercises, 97530- Therapeutic activity, 97112- Neuromuscular re-education, 97535- Self Care, 01093- Manual therapy, [phone]- Gait training, [phone]- Canalith repositioning, 97014- Electrical stimulation (unattended), Patient/Family education, Balance training, Stair training, Taping, Dry Needling,  Joint mobilization, Spinal mobilization, Vestibular training, DME instructions, Cryotherapy, and Moist heat  PLAN FOR NEXT SESSION: edu on PD community resources, balance reactions backwards, stretching hip flexors, strengthening cervical extensors to address posture; initiate HEP     Anette Guarneri, PT, DPT 10/08/23 9:24 AM  [facility] at [facility] [address], [address] [place], [place] [address] Phone # [phone] Fax # [phone]

## 2023-10-09 ENCOUNTER — Ambulatory Visit: Payer: Medicare Other | Admitting: Physical Therapy

## 2023-10-09 ENCOUNTER — Encounter: Payer: Self-pay | Admitting: Physical Therapy

## 2023-10-09 DIAGNOSIS — R2681 Unsteadiness on feet: Secondary | ICD-10-CM

## 2023-10-09 DIAGNOSIS — R279 Unspecified lack of coordination: Secondary | ICD-10-CM | POA: Diagnosis not present

## 2023-10-09 DIAGNOSIS — M6281 Muscle weakness (generalized): Secondary | ICD-10-CM | POA: Diagnosis not present

## 2023-10-09 DIAGNOSIS — R293 Abnormal posture: Secondary | ICD-10-CM | POA: Diagnosis not present

## 2023-10-10 ENCOUNTER — Ambulatory Visit: Payer: Medicare Other | Admitting: Physical Therapy

## 2023-10-14 ENCOUNTER — Ambulatory Visit: Payer: Medicare Other | Admitting: Physical Therapy

## 2023-10-21 ENCOUNTER — Ambulatory Visit: Payer: Medicare Other | Admitting: Physical Therapy

## 2023-10-21 DIAGNOSIS — Z961 Presence of intraocular lens: Secondary | ICD-10-CM | POA: Diagnosis not present

## 2023-10-24 ENCOUNTER — Ambulatory Visit: Payer: Medicare Other

## 2023-10-28 ENCOUNTER — Ambulatory Visit: Payer: Medicare Other | Admitting: Physical Therapy

## 2023-10-31 ENCOUNTER — Ambulatory Visit: Payer: Medicare Other | Admitting: Physical Therapy

## 2023-11-01 NOTE — Patient Instructions (Signed)
SURGICAL WAITING ROOM VISITATION  Patients having surgery or a procedure may have no more than 2 support people in the waiting area - these visitors may rotate.    Children under the age of 31 must have an adult with them who is not the patient.  Due to an increase in RSV and influenza rates and associated hospitalizations, children ages 59 and under may not visit patients in [facility] hospitals.  If the patient needs to stay at the hospital during part of their recovery, the visitor guidelines for inpatient rooms apply. Pre-op nurse will coordinate an appropriate time for 1 support person to accompany patient in pre-op.  This support person may not rotate.    Please refer to the [facility] website for the visitor guidelines for Inpatients (after your surgery is over and you are in a regular room).       Your procedure is scheduled on: 11/08/23   Report to [facility] Main Entrance    Report to admitting at  5:15 AM   Call this number if you have problems the morning of surgery [phone]   Do not eat food :After Midnight.   After Midnight you may have the following liquids until 4:15 AM DAY OF SURGERY  Water Non-Citrus Juices (without pulp, NO RED-Apple, White grape, White cranberry) Black Coffee (NO MILK/CREAM OR CREAMERS, sugar ok)  Clear Tea (NO MILK/CREAM OR CREAMERS, sugar ok) regular and decaf                             Plain Jell-O (NO RED)                                           Fruit ices (not with fruit pulp, NO RED)                                     Popsicles (NO RED)                                                               Sports drinks like Gatorade (NO RED)                  The day of surgery:  Drink ONE (1) Pre-Surgery Clear Ensure at 5:15 AM the morning of surgery. Drink in one sitting. Do not sip.  This drink was given to you during your hospital  pre-op appointment visit. Nothing else to drink after completing the  Pre-Surgery Clear  Ensure        Oral Hygiene is also important to reduce your risk of infection.                                    Remember - BRUSH YOUR TEETH THE MORNING OF SURGERY WITH YOUR REGULAR TOOTHPASTE   Stop all vitamins and herbal supplements 7 days before surgery.   Take these medicines the morning of surgery with A SIP OF WATER: Sinemet, Levothyroxine, Rasagiline  You may not have any metal on your body including hair pins, jewelry, and body piercing             Do not wear make-up, lotions, powders, perfumes/cologne, or deodorant  Do not wear nail polish including gel and S&S, artificial/acrylic nails, or any other type of covering on natural nails including finger and toenails. If you have artificial nails, gel coating, etc. that needs to be removed by a nail salon please have this removed prior to surgery or surgery may need to be canceled/ delayed if the surgeon/ anesthesia feels like they are unable to be safely monitored.   Do not shave  48 hours prior to surgery.    Do not bring valuables to the hospital. [facility] IS NOT             RESPONSIBLE   FOR VALUABLES.   Contacts, glasses, dentures or bridgework may not be worn into surgery.   Bring small overnight bag day of surgery.   DO NOT BRING YOUR HOME MEDICATIONS TO THE HOSPITAL. PHARMACY WILL DISPENSE MEDICATIONS LISTED ON YOUR MEDICATION LIST TO YOU DURING YOUR ADMISSION IN THE HOSPITAL!    Patients discharged on the day of surgery will not be allowed to drive home.  Someone NEEDS to stay with you for the first 24 hours after anesthesia.   Special Instructions: Bring a copy of your healthcare power of attorney and living will documents the day of surgery if you haven't scanned them before.              Please read over the following fact sheets you were given: IF YOU HAVE QUESTIONS ABOUT YOUR PRE-OP INSTRUCTIONS PLEASE CALL [phone] Sarah Montoya   If you received a COVID test during your pre-op visit  it is  requested that you wear a mask when out in public, stay away from anyone that may not be feeling well and notify your surgeon if you develop symptoms. If you test positive for Covid or have been in contact with anyone that has tested positive in the last 10 days please notify you surgeon.      Pre-operative 5 CHG Montoya Instructions   You can play a key role in reducing the risk of infection after surgery. Your skin needs to be as free of germs as possible. You can reduce the number of germs on your skin by washing with CHG (chlorhexidine gluconate) soap before surgery. CHG is an antiseptic soap that kills germs and continues to kill germs even after washing.   DO NOT use if you have an allergy to chlorhexidine/CHG or antibacterial soaps. If your skin becomes reddened or irritated, stop using the CHG and notify one of our RNs at [phone].   Please shower with the CHG soap starting 4 days before surgery using the following schedule:     Please keep in mind the following:  DO NOT shave, including legs and underarms, starting the day of your first shower.   You may shave your face at any point before/day of surgery.  Place clean sheets on your bed the day you start using CHG soap. Use a clean washcloth (not used since being washed) for each shower. DO NOT sleep with pets once you start using the CHG.   CHG Shower Instructions:  If you choose to wash your hair and private area, wash first with your normal shampoo/soap.  After you use shampoo/soap, rinse your hair and body thoroughly to remove shampoo/soap residue.  Turn the  water OFF and apply about 3 tablespoons (45 ml) of CHG soap to a CLEAN washcloth.  Apply CHG soap ONLY FROM YOUR NECK DOWN TO YOUR TOES (washing for 3-5 minutes)  DO NOT use CHG soap on face, private areas, open wounds, or sores.  Pay special attention to the area where your surgery is being performed.  If you are having back surgery, having someone wash your back for  you may be helpful. Wait 2 minutes after CHG soap is applied, then you may rinse off the CHG soap.  Pat dry with a clean towel  Put on clean clothes/pajamas   If you choose to wear lotion, please use ONLY the CHG-compatible lotions on the back of this paper.     Additional instructions for the day of surgery: DO NOT APPLY any lotions, deodorants, cologne, or perfumes.   Put on clean/comfortable clothes.  Brush your teeth.  Ask your nurse before applying any prescription medications to the skin.      CHG Compatible Lotions   Aveeno Moisturizing lotion  Cetaphil Moisturizing Cream  Cetaphil Moisturizing Lotion  Clairol Herbal Essence Moisturizing Lotion, Dry Skin  Clairol Herbal Essence Moisturizing Lotion, Extra Dry Skin  Clairol Herbal Essence Moisturizing Lotion, Normal Skin  Curel Age Defying Therapeutic Moisturizing Lotion with Alpha Hydroxy  Curel Extreme Care Body Lotion  Curel Soothing Hands Moisturizing Hand Lotion  Curel Therapeutic Moisturizing Cream, Fragrance-Free  Curel Therapeutic Moisturizing Lotion, Fragrance-Free  Curel Therapeutic Moisturizing Lotion, Original Formula  Eucerin Daily Replenishing Lotion  Eucerin Dry Skin Therapy Plus Alpha Hydroxy Crme  Eucerin Dry Skin Therapy Plus Alpha Hydroxy Lotion  Eucerin Original Crme  Eucerin Original Lotion  Eucerin Plus Crme Eucerin Plus Lotion  Eucerin TriLipid Replenishing Lotion  Keri Anti-Bacterial Hand Lotion  Keri Deep Conditioning Original Lotion Dry Skin Formula Softly Scented  Keri Deep Conditioning Original Lotion, Fragrance Free Sensitive Skin Formula  Keri Lotion Fast Absorbing Fragrance Free Sensitive Skin Formula  Keri Lotion Fast Absorbing Softly Scented Dry Skin Formula  Keri Original Lotion  Keri Skin Renewal Lotion Keri Silky Smooth Lotion  Keri Silky Smooth Sensitive Skin Lotion  Nivea Body Creamy Conditioning Oil  Nivea Body Extra Enriched Lotion  Nivea Body Original Lotion  Nivea Body  Sheer Moisturizing Lotion Nivea Crme  Nivea Skin Firming Lotion  NutraDerm 30 Skin Lotion  NutraDerm Skin Lotion  NutraDerm Therapeutic Skin Cream  NutraDerm Therapeutic Skin Lotion  ProShield Protective Hand Cream   Incentive Spirometer (Watch this video at home: [email or web address])  An incentive spirometer is a tool that can help keep your lungs clear and active. This tool measures how well you are filling your lungs with each breath. Taking long deep breaths may help reverse or decrease the chance of developing breathing (pulmonary) problems (especially infection) following: A long period of time when you are unable to move or be active. BEFORE THE PROCEDURE  If the spirometer includes an indicator to show your best effort, your nurse or respiratory therapist will set it to a desired goal. If possible, sit up straight or lean slightly forward. Try not to slouch. Hold the incentive spirometer in an upright position. INSTRUCTIONS FOR USE  Sit on the edge of your bed if possible, or sit up as far as you can in bed or on a chair. Hold the incentive spirometer in an upright position. Breathe out normally. Place the mouthpiece in your mouth and seal your lips tightly around it. Breathe in slowly and as deeply as possible, raising  the piston or the ball toward the top of the column. Hold your breath for 3-5 seconds or for as long as possible. Allow the piston or ball to fall to the bottom of the column. Remove the mouthpiece from your mouth and breathe out normally. Rest for a few seconds and repeat Steps 1 through 7 at least 10 times every 1-2 hours when you are awake. Take your time and take a few normal breaths between deep breaths. The spirometer may include an indicator to show your best effort. Use the indicator as a goal to work toward during each repetition. After each set of 10 deep breaths, practice coughing to be sure your lungs are clear. If you have an  incision (the cut made at the time of surgery), support your incision when coughing by placing a pillow or rolled up towels firmly against it. Once you are able to get out of bed, walk around indoors and cough well. You may stop using the incentive spirometer when instructed by your caregiver.  RISKS AND COMPLICATIONS Take your time so you do not get dizzy or light-headed. If you are in pain, you may need to take or ask for pain medication before doing incentive spirometry. It is harder to take a deep breath if you are having pain. AFTER USE Rest and breathe slowly and easily. It can be helpful to keep track of a log of your progress. Your caregiver can provide you with a simple table to help with this. If you are using the spirometer at home, follow these instructions: SEEK MEDICAL CARE IF:  You are having difficultly using the spirometer. You have trouble using the spirometer as often as instructed. Your pain medication is not giving enough relief while using the spirometer. You develop fever of 100.5 F (38.1 C) or higher. SEEK IMMEDIATE MEDICAL CARE IF:  You cough up bloody sputum that had not been present before. You develop fever of 102 F (38.9 C) or greater. You develop worsening pain at or near the incision site. MAKE SURE YOU:  Understand these instructions. Will watch your condition. Will get help right away if you are not doing well or get worse. Document Released: 03/18/2007 Document Revised: 01/28/2012 Document Reviewed: 05/19/2007 [facility] Patient Information 2014 [place], [place].

## 2023-11-01 NOTE — Progress Notes (Signed)
COVID Vaccine received:  []  No [x]  Yes Date of any COVID positive Test in last 90 days: no PCP -Dr. Margaretann Loveless Cardiologist - no  Chest x-ray - no EKG -   Stress Test - no ECHO - no Cardiac Cath - no  Bowel Prep - [x]  No  []   Yes ______  Pacemaker / ICD device [x]  No []  Yes   Spinal Cord Stimulator:[x]  No []  Yes       History of Sleep Apnea? [x]  No []  Yes   CPAP used?- [x]  No []  Yes    Does the patient monitor blood sugar?          [x]  No []  Yes  []  N/A  Patient has: [x]  NO Hx DM   []  Pre-DM                 []  DM1  []   DM2 Does patient have a [employer] or [employer]om? []  No []  Yes   Fasting Blood Sugar Ranges-  Checks Blood Sugar _____ times a day  GLP1 agonist / usual dose - no GLP1 instructions:  SGLT-2 inhibitors / usual dose - no  Blood Thinner / Instructions:no Aspirin Instructions:no  Comments:   Activity level: Patient is able  to climb a flight of stairs without difficulty; [x]  No CP  [x]  No SOB, ___   Patient can perform ADLs without assistance.   Anesthesia review: Parkinson's  Patient denies shortness of breath, fever, cough and chest pain at PAT appointment.  Patient verbalized understanding and agreement to the Pre-Surgical Instructions that were given to them at this PAT appointment. Patient was also educated of the need to review these PAT instructions again prior to his/her surgery.I reviewed the appropriate phone numbers to call if they have any and questions or concerns.

## 2023-11-04 ENCOUNTER — Encounter (HOSPITAL_COMMUNITY): Payer: Self-pay

## 2023-11-04 ENCOUNTER — Other Ambulatory Visit: Payer: Self-pay

## 2023-11-04 ENCOUNTER — Encounter (HOSPITAL_COMMUNITY)
Admission: RE | Admit: 2023-11-04 | Discharge: 2023-11-04 | Disposition: A | Discharge status: Home / Self Care | Payer: Medicare Other | Source: Ambulatory Visit | Attending: Orthopaedic Surgery

## 2023-11-04 VITALS — BP 139/92 | HR 92 | Temp 97.6°F | Resp 16 | Ht 65.0 in | Wt 122.0 lb

## 2023-11-04 DIAGNOSIS — Z01812 Encounter for preprocedural laboratory examination: Secondary | ICD-10-CM | POA: Insufficient documentation

## 2023-11-04 DIAGNOSIS — Z79899 Other long term (current) drug therapy: Secondary | ICD-10-CM | POA: Diagnosis not present

## 2023-11-04 DIAGNOSIS — M1611 Unilateral primary osteoarthritis, right hip: Secondary | ICD-10-CM | POA: Diagnosis not present

## 2023-11-04 DIAGNOSIS — Z01818 Encounter for other preprocedural examination: Secondary | ICD-10-CM

## 2023-11-04 HISTORY — DX: Unspecified osteoarthritis, unspecified site: M19.90

## 2023-11-04 HISTORY — DX: Headache, unspecified: R51.9

## 2023-11-04 HISTORY — DX: Myoneural disorder, unspecified: G70.9

## 2023-11-04 LAB — CBC
HCT: 43.5 % (ref 36.0–46.0)
Hemoglobin: 14 g/dL (ref 12.0–15.0)
MCH: 30.4 pg (ref 26.0–34.0)
MCHC: 32.2 g/dL (ref 30.0–36.0)
MCV: 94.4 fL (ref 80.0–100.0)
Platelets: 247 10*3/uL (ref 150–400)
RBC: 4.61 MIL/uL (ref 3.87–5.11)
RDW: 13.2 % (ref 11.5–15.5)
WBC: 7 10*3/uL (ref 4.0–10.5)
nRBC: 0 % (ref 0.0–0.2)

## 2023-11-04 LAB — BASIC METABOLIC PANEL
Anion gap: 7 (ref 5–15)
BUN: 19 mg/dL (ref 8–23)
CO2: 25 mmol/L (ref 22–32)
Calcium: 9.5 mg/dL (ref 8.9–10.3)
Chloride: 107 mmol/L (ref 98–111)
Creatinine, Ser: 0.64 mg/dL (ref 0.44–1.00)
GFR, Estimated: >60 mL/min (ref >60)
Glucose, Bld: 61 mg/dL — ABNORMAL LOW (ref 70–99)
Potassium: 5 mmol/L (ref 3.5–5.1)
Sodium: 139 mmol/L (ref 135–145)

## 2023-11-04 LAB — SURGICAL PCR SCREEN
MRSA, PCR: NEGATIVE
Staphylococcus aureus: POSITIVE — AB

## 2023-11-04 NOTE — Progress Notes (Signed)
Please review pre op PCR results from 11/04/23.

## 2023-11-06 DIAGNOSIS — E039 Hypothyroidism, unspecified: Secondary | ICD-10-CM | POA: Diagnosis not present

## 2023-11-06 DIAGNOSIS — E559 Vitamin D deficiency, unspecified: Secondary | ICD-10-CM | POA: Diagnosis not present

## 2023-11-06 DIAGNOSIS — M1611 Unilateral primary osteoarthritis, right hip: Secondary | ICD-10-CM | POA: Diagnosis not present

## 2023-11-06 DIAGNOSIS — M8580 Other specified disorders of bone density and structure, unspecified site: Secondary | ICD-10-CM | POA: Diagnosis not present

## 2023-11-06 DIAGNOSIS — G43909 Migraine, unspecified, not intractable, without status migrainosus: Secondary | ICD-10-CM | POA: Diagnosis not present

## 2023-11-06 DIAGNOSIS — Z79899 Other long term (current) drug therapy: Secondary | ICD-10-CM | POA: Diagnosis not present

## 2023-11-06 DIAGNOSIS — Z0000 Encounter for general adult medical examination without abnormal findings: Secondary | ICD-10-CM | POA: Diagnosis not present

## 2023-11-06 DIAGNOSIS — G20B2 Parkinson's disease with dyskinesia, with fluctuations: Secondary | ICD-10-CM | POA: Diagnosis not present

## 2023-11-06 DIAGNOSIS — E538 Deficiency of other specified B group vitamins: Secondary | ICD-10-CM | POA: Diagnosis not present

## 2023-11-06 DIAGNOSIS — E7800 Pure hypercholesterolemia, unspecified: Secondary | ICD-10-CM | POA: Diagnosis not present

## 2023-11-07 DIAGNOSIS — M1611 Unilateral primary osteoarthritis, right hip: Secondary | ICD-10-CM

## 2023-11-07 NOTE — Anesthesia Preprocedure Evaluation (Addendum)
Anesthesia Evaluation  Patient identified by MRN, date of birth, ID band Patient awake    Reviewed: Allergy & Precautions, NPO status , Patient's Chart, lab work & pertinent test results  Airway Mallampati: II  TM Distance: >3 FB Neck ROM: Full    Dental  (+) Teeth Intact, Dental Advisory Given   Pulmonary neg pulmonary ROS   Pulmonary exam normal breath sounds clear to auscultation       Cardiovascular negative cardio ROS Normal cardiovascular exam Rhythm:Regular Rate:Normal     Neuro/Psych  Headaches Parkinson disease   Neuromuscular disease    GI/Hepatic negative GI ROS, Neg liver ROS,,,  Endo/Other  Hypothyroidism    Renal/GU negative Renal ROS     Musculoskeletal  (+) Arthritis , Osteoarthritis,  Osteoarthritis Right Hip   Abdominal   Peds  Hematology negative hematology ROS (+) Plt 247k   Anesthesia Other Findings   Reproductive/Obstetrics                             Anesthesia Physical Anesthesia Plan  ASA: 3  Anesthesia Plan: Spinal   Post-op Pain Management: Tylenol PO (pre-op)* and Toradol IV (intra-op)*   Induction: Intravenous  PONV Risk Score and Plan: 2 and TIVA, Dexamethasone and Ondansetron  Airway Management Planned: Natural Airway and Simple Face Mask  Additional Equipment:   Intra-op Plan:   Post-operative Plan:   Informed Consent: I have reviewed the patients History and Physical, chart, labs and discussed the procedure including the risks, benefits and alternatives for the proposed anesthesia with the patient or authorized representative who has indicated his/her understanding and acceptance.     Dental advisory given  Plan Discussed with: CRNA  Anesthesia Plan Comments:        Anesthesia Quick Evaluation

## 2023-11-07 NOTE — H&P (Signed)
TOTAL HIP ADMISSION H&P  Patient is admitted for right total hip arthroplasty.  Subjective:  Chief Complaint: right hip pain  HPI: Sarah Montoya, 72 y.o. female, has a history of pain and functional disability in the right hip(s) due to arthritis and patient has failed non-surgical conservative treatments for greater than 12 weeks to include NSAID's and/or analgesics, use of assistive devices, and activity modification.  Onset of symptoms was gradual starting 2 years ago with gradually worsening course since that time.The patient noted no past surgery on the right hip(s).  Patient currently rates pain in the right hip at 9 out of 10 with activity. Patient has night pain, worsening of pain with activity and weight bearing, trendelenberg gait, pain that interfers with activities of daily living, and pain with passive range of motion. Patient has evidence of subchondral sclerosis, periarticular osteophytes, and joint space narrowing by imaging studies. This condition presents safety issues increasing the risk of falls.  There is no current active infection.  Patient Active Problem List   Diagnosis Date Noted   Unilateral primary osteoarthritis, right hip 11/07/2023   Postmenopausal 09/27/2022   Osteopenia of lumbar spine 09/27/2022   Medication monitoring encounter 04/01/2018   Parkinson's disease (HCC) 04/01/2018   Muscle tone increased 06/12/2017   Bradykinesia 06/12/2017   Dysphonia 06/12/2017   Raynaud's phenomenon without gangrene 06/12/2017   Tarsal tunnel syndrome 02/21/2017   Hypothyroidism 10/21/2014   Hypersomnia, persistent 10/21/2014   Injury of meniscus of right knee 07/22/2014   Subacromial bursitis 02/04/2013   Abnormality of gait 08/13/2012   Enthesopathy of ankle and tarsus 10/04/2009   Past Medical History:  Diagnosis Date   Arthritis    Basal cell carcinoma    Drowsiness    excessive daytime   Headache    Hx of migraines    Hypersomnia 08/26/2013   Hypothyroidism     Hypothyroidism 10/21/2014   Narcolepsy    Neuromuscular disorder (HCC)    Parkinson disease (HCC)     Past Surgical History:  Procedure Laterality Date   CATARACT EXTRACTION Bilateral    MENISCUS REPAIR Right 05/19/2014    No current facility-administered medications for this encounter.   Current Outpatient Medications  Medication Sig Dispense Refill Last Dose/Taking   carbidopa-levodopa (SINEMET IR) 25-100 MG tablet Take 1 tablet by mouth 3 (three) times daily. Must be seen for further refills. Call [phone] to schedule. 270 tablet 3 Taking   Cholecalciferol (D3 HIGH POTENCY) 50 MCG (2000 UT) CAPS Take 2,000 Units by mouth daily.   Taking   Cyanocobalamin (B-12) 1000 MCG CAPS Take 1,000 mcg by mouth once a week.   Taking   diclofenac Sodium (VOLTAREN) 1 % GEL Apply 1 Application topically 3 (three) times daily as needed (joint pain).   Taking As Needed   Lactase (LACTAID PO) Take 1 tablet by mouth as needed (lactose Intolerence).   Taking As Needed   levothyroxine (SYNTHROID) 75 MCG tablet Take 75 mcg by mouth daily before breakfast.   Taking   meloxicam (MOBIC) 7.5 MG tablet Take 7.5 mg by mouth daily.   Taking   rasagiline (AZILECT) 1 MG TABS tablet Take 1 tablet by mouth once daily 90 tablet 3 Taking   SUMAtriptan (IMITREX) 100 MG tablet Take 100 mg by mouth every 2 (two) hours as needed for migraine.   Taking As Needed   clobetasol ointment (TEMOVATE) 0.05 % Apply bid for up to 7 days for itching (Patient not taking: Reported on 11/01/2023) 45 g 0  Not Taking   estradiol (ESTRACE) 0.1 MG/GM vaginal cream 1 gram vaginally up two twice weekly. (Patient not taking: Reported on 11/01/2023) 42.5 g 2 Not Taking   mometasone (ELOCON) 0.1 % ointment Apply topically no more than twice weekly (Patient not taking: Reported on 11/01/2023) 45 g 1 Not Taking   NONFORMULARY OR COMPOUNDED ITEM Topical testosterone cream 1mg / 0.81ml.  Apply 0.64ml topically two times weekly. #59month supply/1RF  (Patient not taking: Reported on 11/01/2023) 1 each 1 Not Taking   Allergies  Allergen Reactions   Ciprofloxacin Diarrhea    Upset stomach   Codeine Nausea Only    Social History   Tobacco Use   Smoking status: Never   Smokeless tobacco: Never  Substance Use Topics   Alcohol use: Yes    Comment: once a month    Family History  Problem Relation Age of Onset   Breast cancer Mother 51   Osteoporosis Mother    Prostate cancer Father    Thyroid disease Father    Mental retardation Sister      Review of Systems  Musculoskeletal:  Positive for gait problem.  Neurological:  Positive for tremors.    Objective:  Physical Exam Vitals reviewed.  Constitutional:      Appearance: Normal appearance. She is normal weight.  HENT:     Head: Normocephalic and atraumatic.  Eyes:     Extraocular Movements: Extraocular movements intact.     Pupils: Pupils are equal, round, and reactive to light.  Cardiovascular:     Rate and Rhythm: Normal rate and regular rhythm.     Pulses: Normal pulses.  Pulmonary:     Effort: Pulmonary effort is normal.     Breath sounds: Normal breath sounds.  Abdominal:     Palpations: Abdomen is soft.  Musculoskeletal:     Cervical back: Normal range of motion and neck supple.     Right hip: Tenderness and bony tenderness present. Decreased range of motion. Decreased strength.  Neurological:     Mental Status: She is alert and oriented to person, place, and time.  Psychiatric:        Behavior: Behavior normal.     Vital signs in last 24 hours:    Labs:   Estimated body mass index is 20.3 kg/m as calculated from the following:   Height as of 11/04/23: 5\' 5"  (1.651 m).   Weight as of 11/04/23: 55.3 kg.   Imaging Review Plain radiographs demonstrate severe degenerative joint disease of the right hip(s). The bone quality appears to be good for age and reported activity level.      Assessment/Plan:  End stage arthritis, right  hip(s)  The patient history, physical examination, clinical judgement of the provider and imaging studies are consistent with end stage degenerative joint disease of the right hip(s) and total hip arthroplasty is deemed medically necessary. The treatment options including medical management, injection therapy, arthroscopy and arthroplasty were discussed at length. The risks and benefits of total hip arthroplasty were presented and reviewed. The risks due to aseptic loosening, infection, stiffness, dislocation/subluxation,  thromboembolic complications and other imponderables were discussed.  The patient acknowledged the explanation, agreed to proceed with the plan and consent was signed. Patient is being admitted for inpatient treatment for surgery, pain control, PT, OT, prophylactic antibiotics, VTE prophylaxis, progressive ambulation and ADL's and discharge planning.The patient is planning to be discharged home with home health services

## 2023-11-08 ENCOUNTER — Other Ambulatory Visit: Payer: Self-pay

## 2023-11-08 ENCOUNTER — Observation Stay (HOSPITAL_COMMUNITY)
Admission: RE | Admit: 2023-11-08 | Discharge: 2023-11-09 | Disposition: A | Discharge status: Home / Self Care | Payer: Medicare Other | Source: Ambulatory Visit | Attending: Orthopaedic Surgery | Admitting: Orthopaedic Surgery

## 2023-11-08 ENCOUNTER — Observation Stay (HOSPITAL_COMMUNITY): Payer: Medicare Other

## 2023-11-08 ENCOUNTER — Encounter (HOSPITAL_COMMUNITY)
Admission: RE | Disposition: A | Discharge status: Home / Self Care | Payer: Self-pay | Source: Ambulatory Visit | Attending: Orthopaedic Surgery

## 2023-11-08 ENCOUNTER — Encounter (HOSPITAL_COMMUNITY): Payer: Self-pay | Admitting: Orthopaedic Surgery

## 2023-11-08 ENCOUNTER — Ambulatory Visit (HOSPITAL_COMMUNITY): Payer: Medicare Other

## 2023-11-08 ENCOUNTER — Ambulatory Visit (HOSPITAL_COMMUNITY): Payer: Medicare Other | Admitting: Anesthesiology

## 2023-11-08 ENCOUNTER — Ambulatory Visit (HOSPITAL_COMMUNITY): Payer: Medicare Other | Admitting: Physician Assistant

## 2023-11-08 DIAGNOSIS — Z79899 Other long term (current) drug therapy: Secondary | ICD-10-CM | POA: Insufficient documentation

## 2023-11-08 DIAGNOSIS — M1611 Unilateral primary osteoarthritis, right hip: Principal | ICD-10-CM | POA: Insufficient documentation

## 2023-11-08 DIAGNOSIS — Z85828 Personal history of other malignant neoplasm of skin: Secondary | ICD-10-CM | POA: Insufficient documentation

## 2023-11-08 DIAGNOSIS — Z471 Aftercare following joint replacement surgery: Secondary | ICD-10-CM | POA: Diagnosis not present

## 2023-11-08 DIAGNOSIS — E039 Hypothyroidism, unspecified: Secondary | ICD-10-CM

## 2023-11-08 DIAGNOSIS — G20C Parkinsonism, unspecified: Secondary | ICD-10-CM | POA: Insufficient documentation

## 2023-11-08 DIAGNOSIS — Z96641 Presence of right artificial hip joint: Secondary | ICD-10-CM | POA: Diagnosis not present

## 2023-11-08 HISTORY — PX: TOTAL HIP ARTHROPLASTY: SHX124

## 2023-11-08 LAB — TYPE AND SCREEN
ABO/RH(D): A POS
Antibody Screen: NEGATIVE

## 2023-11-08 LAB — ABO/RH: ABO/RH(D): A POS

## 2023-11-08 SURGERY — ARTHROPLASTY, HIP, TOTAL, ANTERIOR APPROACH
Anesthesia: Spinal | Site: Hip | Laterality: Right

## 2023-11-08 MED ORDER — PHENOL 1.4 % MT LIQD: 1.0000 | OROMUCOSAL | Status: DC | PRN

## 2023-11-08 MED ORDER — ACETAMINOPHEN 500 MG PO TABS
1000.0000 mg | ORAL_TABLET | Freq: Once | ORAL | Status: AC
  Administered 2023-11-08: 1000 mg via ORAL
  Filled 2023-11-08: qty 2

## 2023-11-08 MED ORDER — EPHEDRINE 5 MG/ML INJ
INTRAVENOUS | Status: AC
  Filled 2023-11-08: qty 5

## 2023-11-08 MED ORDER — TRANEXAMIC ACID-NACL 1000-0.7 MG/100ML-% IV SOLN
1000.0000 mg | INTRAVENOUS | Status: AC
  Administered 2023-11-08: 1000 mg via INTRAVENOUS
  Filled 2023-11-08: qty 100

## 2023-11-08 MED ORDER — CHLORHEXIDINE GLUCONATE 4 % EX SOLN: 1.0000 | CUTANEOUS | 1 refills | Status: DC

## 2023-11-08 MED ORDER — OXYCODONE HCL 5 MG PO TABS: 10.0000 mg | ORAL_TABLET | ORAL | Status: DC | PRN

## 2023-11-08 MED ORDER — CARBIDOPA-LEVODOPA 25-100 MG PO TABS
1.0000 | ORAL_TABLET | Freq: Three times a day (TID) | ORAL | Status: DC
Start: 2023-11-08 — End: 2023-11-08

## 2023-11-08 MED ORDER — ASPIRIN 81 MG PO CHEW
81.0000 mg | CHEWABLE_TABLET | Freq: Two times a day (BID) | ORAL | Status: DC
  Administered 2023-11-09 (×2): 81 mg via ORAL
  Filled 2023-11-08 (×2): qty 1

## 2023-11-08 MED ORDER — DEXMEDETOMIDINE HCL IN NACL 80 MCG/20ML IV SOLN
INTRAVENOUS | Status: AC
  Filled 2023-11-08: qty 20

## 2023-11-08 MED ORDER — CEFAZOLIN SODIUM-DEXTROSE 2-4 GM/100ML-% IV SOLN
2.0000 g | INTRAVENOUS | Status: AC
  Administered 2023-11-08: 2 g via INTRAVENOUS
  Filled 2023-11-08: qty 100

## 2023-11-08 MED ORDER — METOCLOPRAMIDE HCL 5 MG PO TABS: 5.0000 mg | ORAL_TABLET | Freq: Three times a day (TID) | ORAL | Status: DC | PRN

## 2023-11-08 MED ORDER — ONDANSETRON HCL 4 MG/2ML IJ SOLN: 4.0000 mg | Freq: Once | INTRAMUSCULAR | Status: DC | PRN

## 2023-11-08 MED ORDER — ORAL CARE MOUTH RINSE: 15.0000 mL | Freq: Once | OROMUCOSAL | Status: AC

## 2023-11-08 MED ORDER — METHOCARBAMOL 1000 MG/10ML IJ SOLN: 500.0000 mg | Freq: Four times a day (QID) | INTRAMUSCULAR | Status: DC | PRN

## 2023-11-08 MED ORDER — SODIUM CHLORIDE 0.9 % IV SOLN: INTRAVENOUS | Status: DC

## 2023-11-08 MED ORDER — VITAMIN D3 25 MCG (1000 UNIT) PO TABS
2000.0000 [IU] | ORAL_TABLET | Freq: Every day | ORAL | Status: DC
  Administered 2023-11-08 – 2023-11-09 (×2): 2000 [IU] via ORAL
  Filled 2023-11-08 (×4): qty 2

## 2023-11-08 MED ORDER — CEFAZOLIN SODIUM-DEXTROSE 1-4 GM/50ML-% IV SOLN
1.0000 g | Freq: Four times a day (QID) | INTRAVENOUS | Status: AC
  Administered 2023-11-08 (×2): 1 g via INTRAVENOUS
  Filled 2023-11-08 (×2): qty 50

## 2023-11-08 MED ORDER — PHENYLEPHRINE 80 MCG/ML (10ML) SYRINGE FOR IV PUSH (FOR BLOOD PRESSURE SUPPORT)
PREFILLED_SYRINGE | INTRAVENOUS | Status: AC
Start: 2023-11-08 — End: ?
  Filled 2023-11-08: qty 10

## 2023-11-08 MED ORDER — LEVOTHYROXINE SODIUM 75 MCG PO TABS
75.0000 ug | ORAL_TABLET | Freq: Every day | ORAL | Status: DC
  Administered 2023-11-09: 75 ug via ORAL
  Filled 2023-11-08: qty 1

## 2023-11-08 MED ORDER — ONDANSETRON HCL 4 MG PO TABS: 4.0000 mg | ORAL_TABLET | Freq: Four times a day (QID) | ORAL | Status: DC | PRN

## 2023-11-08 MED ORDER — HYDROMORPHONE HCL 1 MG/ML IJ SOLN: 0.5000 mg | INTRAMUSCULAR | Status: DC | PRN

## 2023-11-08 MED ORDER — CARBIDOPA-LEVODOPA 25-100 MG PO TABS
1.0000 | ORAL_TABLET | ORAL | Status: DC
  Administered 2023-11-08 – 2023-11-09 (×4): 1 via ORAL
  Filled 2023-11-08 (×4): qty 1

## 2023-11-08 MED ORDER — FENTANYL CITRATE (PF) 100 MCG/2ML IJ SOLN
INTRAMUSCULAR | Status: AC
  Filled 2023-11-08: qty 2

## 2023-11-08 MED ORDER — FENTANYL CITRATE PF 50 MCG/ML IJ SOSY: 25.0000 ug | PREFILLED_SYRINGE | INTRAMUSCULAR | Status: DC | PRN

## 2023-11-08 MED ORDER — RASAGILINE MESYLATE 1 MG PO TABS
1.0000 mg | ORAL_TABLET | Freq: Every day | ORAL | Status: DC
Start: 2023-11-09 — End: 2023-11-09
  Administered 2023-11-09: 1 mg via ORAL
  Filled 2023-11-08: qty 1

## 2023-11-08 MED ORDER — PHENYLEPHRINE 80 MCG/ML (10ML) SYRINGE FOR IV PUSH (FOR BLOOD PRESSURE SUPPORT)
PREFILLED_SYRINGE | INTRAVENOUS | Status: AC
  Filled 2023-11-08: qty 10

## 2023-11-08 MED ORDER — PROPOFOL 500 MG/50ML IV EMUL
INTRAVENOUS | Status: DC | PRN
  Administered 2023-11-08: 50 ug/kg/min via INTRAVENOUS
  Administered 2023-11-08: 30 mg via INTRAVENOUS

## 2023-11-08 MED ORDER — 0.9 % SODIUM CHLORIDE (POUR BTL) OPTIME
TOPICAL | Status: DC | PRN
  Administered 2023-11-08: 1000 mL

## 2023-11-08 MED ORDER — SODIUM CHLORIDE 0.9 % IR SOLN
Status: DC | PRN
  Administered 2023-11-08: 1000 mL

## 2023-11-08 MED ORDER — MIDAZOLAM HCL 2 MG/2ML IJ SOLN
INTRAMUSCULAR | Status: AC
  Filled 2023-11-08: qty 2

## 2023-11-08 MED ORDER — POVIDONE-IODINE 10 % EX SWAB: 2.0000 | Freq: Once | CUTANEOUS | Status: DC

## 2023-11-08 MED ORDER — CHLORHEXIDINE GLUCONATE 0.12 % MT SOLN
15.0000 mL | Freq: Once | OROMUCOSAL | Status: AC
Start: 2023-11-08 — End: 2023-11-08
  Administered 2023-11-08: 15 mL via OROMUCOSAL

## 2023-11-08 MED ORDER — ONDANSETRON HCL 4 MG/2ML IJ SOLN
INTRAMUSCULAR | Status: DC | PRN
  Administered 2023-11-08: 4 mg via INTRAVENOUS

## 2023-11-08 MED ORDER — MUPIROCIN 2 % EX OINT: 1.0000 | TOPICAL_OINTMENT | Freq: Two times a day (BID) | CUTANEOUS | 0 refills | Status: AC

## 2023-11-08 MED ORDER — MIDAZOLAM HCL 2 MG/2ML IJ SOLN
INTRAMUSCULAR | Status: DC | PRN
  Administered 2023-11-08: 2 mg via INTRAVENOUS

## 2023-11-08 MED ORDER — DOCUSATE SODIUM 100 MG PO CAPS
100.0000 mg | ORAL_CAPSULE | Freq: Two times a day (BID) | ORAL | Status: DC
Start: 2023-11-08 — End: 2023-11-09
  Administered 2023-11-08 – 2023-11-09 (×2): 100 mg via ORAL
  Filled 2023-11-08 (×2): qty 1

## 2023-11-08 MED ORDER — DEXAMETHASONE SODIUM PHOSPHATE 10 MG/ML IJ SOLN
INTRAMUSCULAR | Status: AC
  Filled 2023-11-08: qty 3

## 2023-11-08 MED ORDER — OXYCODONE HCL 5 MG PO TABS
5.0000 mg | ORAL_TABLET | ORAL | Status: DC | PRN
  Administered 2023-11-08 (×2): 5 mg via ORAL
  Administered 2023-11-08: 10 mg via ORAL
  Filled 2023-11-08: qty 2
  Filled 2023-11-08: qty 1

## 2023-11-08 MED ORDER — ONDANSETRON HCL 4 MG/2ML IJ SOLN: 4.0000 mg | Freq: Four times a day (QID) | INTRAMUSCULAR | Status: DC | PRN

## 2023-11-08 MED ORDER — DEXAMETHASONE SODIUM PHOSPHATE 10 MG/ML IJ SOLN
INTRAMUSCULAR | Status: DC | PRN
  Administered 2023-11-08: 10 mg via INTRAVENOUS

## 2023-11-08 MED ORDER — ALUM & MAG HYDROXIDE-SIMETH 200-200-20 MG/5ML PO SUSP: 30.0000 mL | ORAL | Status: DC | PRN

## 2023-11-08 MED ORDER — PROPOFOL 1000 MG/100ML IV EMUL
INTRAVENOUS | Status: AC
  Filled 2023-11-08: qty 200

## 2023-11-08 MED ORDER — LIDOCAINE HCL (PF) 2 % IJ SOLN
INTRAMUSCULAR | Status: AC
  Filled 2023-11-08: qty 15

## 2023-11-08 MED ORDER — BUPIVACAINE IN DEXTROSE 0.75-8.25 % IT SOLN
INTRATHECAL | Status: DC | PRN
  Administered 2023-11-08: 1.6 mL via INTRATHECAL

## 2023-11-08 MED ORDER — HYDROCODONE-ACETAMINOPHEN 5-325 MG PO TABS: 1.0000 | ORAL_TABLET | ORAL | Status: DC | PRN

## 2023-11-08 MED ORDER — PHENYLEPHRINE 80 MCG/ML (10ML) SYRINGE FOR IV PUSH (FOR BLOOD PRESSURE SUPPORT)
PREFILLED_SYRINGE | INTRAVENOUS | Status: DC | PRN
  Administered 2023-11-08: 50 ug via INTRAVENOUS
  Administered 2023-11-08: 100 ug via INTRAVENOUS

## 2023-11-08 MED ORDER — METOCLOPRAMIDE HCL 5 MG/ML IJ SOLN: 5.0000 mg | Freq: Three times a day (TID) | INTRAMUSCULAR | Status: DC | PRN

## 2023-11-08 MED ORDER — MENTHOL 3 MG MT LOZG: 1.0000 | LOZENGE | OROMUCOSAL | Status: DC | PRN

## 2023-11-08 MED ORDER — PANTOPRAZOLE SODIUM 40 MG PO TBEC
40.0000 mg | DELAYED_RELEASE_TABLET | Freq: Every day | ORAL | Status: DC
  Administered 2023-11-08 – 2023-11-09 (×2): 40 mg via ORAL
  Filled 2023-11-08 (×2): qty 1

## 2023-11-08 MED ORDER — ACETAMINOPHEN 325 MG PO TABS
325.0000 mg | ORAL_TABLET | Freq: Four times a day (QID) | ORAL | Status: DC | PRN
  Filled 2023-11-08 (×2): qty 2

## 2023-11-08 MED ORDER — LIDOCAINE 2% (20 MG/ML) 5 ML SYRINGE
INTRAMUSCULAR | Status: DC | PRN
  Administered 2023-11-08: 60 mg via INTRAVENOUS

## 2023-11-08 MED ORDER — DIPHENHYDRAMINE HCL 12.5 MG/5ML PO ELIX: 12.5000 mg | ORAL_SOLUTION | ORAL | Status: DC | PRN

## 2023-11-08 MED ORDER — METHOCARBAMOL 500 MG PO TABS
500.0000 mg | ORAL_TABLET | Freq: Four times a day (QID) | ORAL | Status: DC | PRN
  Administered 2023-11-08: 500 mg via ORAL
  Filled 2023-11-08: qty 1

## 2023-11-08 MED ORDER — TRAMADOL HCL 50 MG PO TABS
100.0000 mg | ORAL_TABLET | Freq: Four times a day (QID) | ORAL | Status: DC | PRN
  Administered 2023-11-09: 100 mg via ORAL
  Filled 2023-11-08: qty 2

## 2023-11-08 MED ORDER — LACTATED RINGERS IV SOLN: INTRAVENOUS | Status: DC

## 2023-11-08 MED ORDER — ONDANSETRON HCL 4 MG/2ML IJ SOLN
INTRAMUSCULAR | Status: AC
  Filled 2023-11-08: qty 6

## 2023-11-08 SURGICAL SUPPLY — 37 items
BAG COUNTER SPONGE SURGICOUNT (BAG) ×1 IMPLANT
BAG ZIPLOCK 12X15 (MISCELLANEOUS) IMPLANT
BENZOIN TINCTURE PRP APPL 2/3 (GAUZE/BANDAGES/DRESSINGS) IMPLANT
BLADE SAW SGTL 18X1.27X75 (BLADE) ×1 IMPLANT
COVER PERINEAL POST (MISCELLANEOUS) ×1 IMPLANT
COVER SURGICAL LIGHT HANDLE (MISCELLANEOUS) ×1 IMPLANT
CUP SECTOR GRIPTON 50MM (Cup) IMPLANT
DRAPE FOOT SWITCH (DRAPES) ×1 IMPLANT
DRAPE STERI IOBAN 125X83 (DRAPES) ×1 IMPLANT
DRAPE U-SHAPE 47X51 STRL (DRAPES) ×2 IMPLANT
DRSG AQUACEL AG ADV 3.5X10 (GAUZE/BANDAGES/DRESSINGS) ×1 IMPLANT
DURAPREP 26ML APPLICATOR (WOUND CARE) ×1 IMPLANT
ELECT REM PT RETURN 15FT ADLT (MISCELLANEOUS) ×1 IMPLANT
GAUZE XEROFORM 1X8 LF (GAUZE/BANDAGES/DRESSINGS) IMPLANT
GLOVE BIO SURGEON STRL SZ7.5 (GLOVE) ×1 IMPLANT
GLOVE BIOGEL PI IND STRL 8 (GLOVE) ×2 IMPLANT
GLOVE ECLIPSE 8.0 STRL XLNG CF (GLOVE) ×1 IMPLANT
GOWN STRL REUS W/ TWL XL LVL3 (GOWN DISPOSABLE) ×2 IMPLANT
HEAD FEM STD 32X+5 STRL (Hips) IMPLANT
HEAD FEM STD 32X+9 STRL (Hips) IMPLANT
HOLDER FOLEY CATH W/STRAP (MISCELLANEOUS) ×1 IMPLANT
KIT TURNOVER KIT A (KITS) IMPLANT
LINER ACETABULAR 32X50 (Liner) IMPLANT
PACK ANTERIOR HIP CUSTOM (KITS) ×1 IMPLANT
SET HNDPC FAN SPRY TIP SCT (DISPOSABLE) ×1 IMPLANT
STAPLER SKIN PROX WIDE 3.9 (STAPLE) IMPLANT
STEM FEM ACTIS HIGH SZ3 (Stem) IMPLANT
STRIP CLOSURE SKIN 1/2X4 (GAUZE/BANDAGES/DRESSINGS) IMPLANT
SUT ETHIBOND NAB CT1 #1 30IN (SUTURE) ×1 IMPLANT
SUT ETHILON 2 0 PS N (SUTURE) IMPLANT
SUT MNCRL AB 4-0 PS2 18 (SUTURE) IMPLANT
SUT VIC AB 0 CT1 36 (SUTURE) ×1 IMPLANT
SUT VIC AB 1 CT1 36 (SUTURE) ×1 IMPLANT
SUT VIC AB 2-0 CT1 TAPERPNT 27 (SUTURE) ×2 IMPLANT
TRAY FOLEY MTR SLVR 14FR STAT (SET/KITS/TRAYS/PACK) IMPLANT
TRAY FOLEY MTR SLVR 16FR STAT (SET/KITS/TRAYS/PACK) IMPLANT
YANKAUER SUCT BULB TIP NO VENT (SUCTIONS) ×1 IMPLANT

## 2023-11-08 NOTE — Progress Notes (Signed)
18:00 RN rounding on Pt. RN found Pt standing near the door, attempting to ambulate without walker. Pt pulled out her L FA peripheral IV, catheter intact, no bleeding noted. Pt also removed her armband, fall and allergy bracelets and TED hose. Pt is oriented to self but disoriented to place, time and situation. Pt also stated that she needs to go to the bathroom. RN reoriented Pt and assisted in ambulating in the room and back to bed.   18:50 Pt now resting in bed, alert and able to tell her name and birthday, that she had a hip surgery at [facility]. Pt also stated that the pain medicine she had taken earlier worked because she is not in pain 0/10. Call bell within reach, bed alarm is on, bed to lowest position.

## 2023-11-08 NOTE — Interval H&P Note (Signed)
History and Physical Interval Note: The patient understands that she is here today for a right total hip replacement to treat her severe right hip arthritis.  There has been no acute or interval change in her medical status.  The risks and benefits of surgery been discussed in detail and informed consent has been obtained.  The right operative hip has been marked.  11/08/2023 6:58 AM  Sarah Montoya  has presented today for surgery, with the diagnosis of Osteoarthritis Right Hip.  The various methods of treatment have been discussed with the patient and family. After consideration of risks, benefits and other options for treatment, the patient has consented to  Procedure(s): RIGHT TOTAL HIP ARTHROPLASTY ANTERIOR APPROACH (Right) as a surgical intervention.  The patient's history has been reviewed, patient examined, no change in status, stable for surgery.  I have reviewed the patient's chart and labs.  Questions were answered to the patient's satisfaction.     Kathryne Hitch

## 2023-11-08 NOTE — Evaluation (Signed)
Physical Therapy Evaluation Patient Details Name: Sarah Montoya MRN: [id number] DOB: 01/30/1951 Today's Date: 11/08/2023  History of Present Illness  Pt s/p R THR and with hx of narcolepsy and Parkinsons  Clinical Impression  Pt s/p R THR and presents with functional mobility limitations 2* decreased R LE strength/ROM, post op pain and ambulatory balance deficits.  Pt should progress to dc home with family assist.        If plan is discharge home, recommend the following: A little help with walking and/or transfers;A little help with bathing/dressing/bathroom;Assistance with cooking/housework;Assist for transportation;Help with stairs or ramp for entrance   Can travel by private vehicle        Equipment Recommendations None recommended by PT  Recommendations for Other Services       Functional Status Assessment Patient has had a recent decline in their functional status and demonstrates the ability to make significant improvements in function in a reasonable and predictable amount of time.     Precautions / Restrictions Precautions Precautions: Fall Restrictions Weight Bearing Restrictions Per Provider Order: No RLE Weight Bearing Per Provider Order: Weight bearing as tolerated      Mobility  Bed Mobility               General bed mobility comments: Up in chair with nursing and returns to same    Transfers Overall transfer level: Needs assistance Equipment used: Rolling walker (2 wheels) Transfers: Sit to/from Stand Sit to Stand: Min assist           General transfer comment: Cues for LE management and use of UEs to self assist.  Physical assist to bring wt up and fwd and to balance in standing with RW    Ambulation/Gait Ambulation/Gait assistance: Min assist Gait Distance (Feet): 95 Feet Assistive device: Rolling walker (2 wheels) Gait Pattern/deviations: Step-to pattern, Step-through pattern, Decreased step length - right, Decreased step length - left,  Shuffle, Trunk flexed Gait velocity: decr     General Gait Details: cues for posture, position from RW and sequence  Stairs            Wheelchair Mobility     Tilt Bed    Modified Rankin (Stroke Patients Only)       Balance Overall balance assessment: Needs assistance Sitting-balance support: No upper extremity supported, Feet supported Sitting balance-Leahy Scale: Good     Standing balance support: Bilateral upper extremity supported Standing balance-Leahy Scale: Poor Standing balance comment: posterior drift                             Pertinent Vitals/Pain Pain Assessment Pain Assessment: 0-10 Pain Score: 4  Pain Location: R hip Pain Descriptors / Indicators: Aching, Sore Pain Intervention(s): Limited activity within patient's tolerance, Monitored during session, Premedicated before session, Ice applied    Home Living Family/patient expects to be discharged to:: Private residence Living Arrangements: Spouse/significant other Available Help at Discharge: Family;Available 24 hours/day Type of Home: House Home Access: Stairs to enter Entrance Stairs-Rails: Right;Left;Can reach both Entrance Stairs-Number of Steps: 14 Alternate Level Stairs-Number of Steps: 14 Home Layout: Multi-level Home Equipment: Agricultural consultant (2 wheels);Cane - single point      Prior Function Prior Level of Function : Independent/Modified Independent                     Extremity/Trunk Assessment   Upper Extremity Assessment Upper Extremity Assessment: Overall WFL for tasks assessed  Lower Extremity Assessment Lower Extremity Assessment: RLE deficits/detail    Cervical / Trunk Assessment Cervical / Trunk Assessment: Normal  Communication   Communication Communication: No apparent difficulties  Cognition Arousal: Alert Behavior During Therapy: WFL for tasks assessed/performed Overall Cognitive Status: Within Functional Limits for tasks assessed                                           General Comments      Exercises Total Joint Exercises Ankle Circles/Pumps: AROM, Both, 15 reps, Supine   Assessment/Plan    PT Assessment Patient needs continued PT services  PT Problem List Decreased strength;Decreased range of motion;Decreased activity tolerance;Decreased balance;Decreased mobility;Decreased knowledge of use of DME;Pain       PT Treatment Interventions DME instruction;Gait training;Stair training;Functional mobility training;Therapeutic activities;Therapeutic exercise;Patient/family education    PT Goals (Current goals can be found in the Care Plan section)  Acute Rehab PT Goals Patient Stated Goal: REgain IND PT Goal Formulation: With patient Time For Goal Achievement: 11/15/23 Potential to Achieve Goals: Good    Frequency 7X/week     Co-evaluation               AM-PAC PT "6 Clicks" Mobility  Outcome Measure Help needed turning from your back to your side while in a flat bed without using bedrails?: A Little Help needed moving from lying on your back to sitting on the side of a flat bed without using bedrails?: A Little Help needed moving to and from a bed to a chair (including a wheelchair)?: A Little Help needed standing up from a chair using your arms (e.g., wheelchair or bedside chair)?: A Little Help needed to walk in hospital room?: A Little Help needed climbing 3-5 steps with a railing? : A Lot 6 Click Score: 17    End of Session Equipment Utilized During Treatment: Gait belt Activity Tolerance: Patient tolerated treatment well Patient left: in chair;with call bell/phone within reach;with chair alarm set;with family/visitor present Nurse Communication: Mobility status PT Visit Diagnosis: Unsteadiness on feet (R26.81);Difficulty in walking, not elsewhere classified (R26.2)    Time: 0865-7846 PT Time Calculation (min) (ACUTE ONLY): 30 min   Charges:   PT Evaluation $PT Eval Low  Complexity: 1 Low PT Treatments $Gait Training: 8-22 mins PT General Charges $$ ACUTE PT VISIT: 1 Visit         Mauro Kaufmann PT Acute Rehabilitation Services Pager [phone] Office [phone]   [facility] 11/08/2023, 5:45 PM

## 2023-11-08 NOTE — TOC Transition Note (Signed)
Transition of Care [facility]) - Discharge Note   Patient Details  Name: Sarah Montoya MRN: [id number] Date of Birth: 06-22-1951  Transition of [facility]) CM/SW Contact:  Amada Jupiter, LCSW Phone Number: 11/08/2023, 2:55 PM   Clinical Narrative:    Met with pt who confirms she has needed DME in the home.  HHPT already arranged by ortho MD office with [facility].  No further TOC needs.   Final next level of care: Home w Home Health Services Barriers to Discharge: No Barriers Identified   Patient Goals and CMS Choice Patient states their goals for this hospitalization and ongoing recovery are:: return home          Discharge Placement                       Discharge Plan and Services Additional resources added to the After Visit Summary for                  DME Arranged: N/A DME Agency: NA       HH Arranged: PT HH Agency: [facility]        Social Drivers of Health (SDOH) Interventions SDOH Screenings   Food Insecurity: No Food Insecurity (11/08/2023)  Housing: Low Risk  (11/08/2023)  Transportation Needs: No Transportation Needs (11/08/2023)  Utilities: Not At Risk (11/08/2023)  Depression (PHQ2-9): Low Risk  (07/13/2021)  Tobacco Use: Low Risk  (11/08/2023)     Readmission Risk Interventions     No data to display

## 2023-11-08 NOTE — Transfer of Care (Signed)
Immediate Anesthesia Transfer of Care Note  Patient: Sarah Montoya  Procedure(s) Performed: Procedure(s): RIGHT TOTAL HIP ARTHROPLASTY ANTERIOR APPROACH (Right)  Patient Location: PACU  Anesthesia Type:Spinal  Level of Consciousness: awake, alert  and oriented  Airway & Oxygen Therapy: Patient Spontanous Breathing  Post-op Assessment: Report given to RN and Post -op Vital signs reviewed and stable  Post vital signs: Reviewed and stable  Last Vitals:  Vitals:   11/08/23 0614  BP: 131/79  Pulse: 88  Resp: 18  Temp: (!) 36.3 C  SpO2: 100%    Complications: No apparent anesthesia complications

## 2023-11-08 NOTE — Plan of Care (Signed)

## 2023-11-08 NOTE — Anesthesia Procedure Notes (Signed)
Spinal  Patient location during procedure: OR Start time: 11/08/2023 7:12 AM End time: 11/08/2023 7:15 AM Reason for block: surgical anesthesia Staffing Performed: anesthesiologist  Anesthesiologist: Collene Schlichter, MD Performed by: Collene Schlichter, MD Authorized by: Collene Schlichter, MD   Preanesthetic Checklist Completed: patient identified, IV checked, risks and benefits discussed, surgical consent, monitors and equipment checked, pre-op evaluation and timeout performed Spinal Block Patient position: sitting Prep: DuraPrep and site prepped and draped Patient monitoring: continuous pulse ox and blood pressure Approach: midline Location: L3-4 Injection technique: single-shot Needle Needle type: Pencan  Needle gauge: 24 G Assessment Events: CSF return Additional Notes Functioning IV was confirmed and monitors were applied. Sterile prep and drape, including hand hygiene, mask and sterile gloves were used. The patient was positioned and the spine was prepped. The skin was anesthetized with lidocaine.  Free flow of clear CSF was obtained prior to injecting local anesthetic into the CSF.  The spinal needle aspirated freely following injection.  The needle was carefully withdrawn.  The patient tolerated the procedure well. Consent was obtained prior to procedure with all questions answered and concerns addressed. Risks including but not limited to bleeding, infection, nerve damage, paralysis, failed block, inadequate analgesia, allergic reaction, high spinal, itching and headache were discussed and the patient wished to proceed.   Arrie Aran, MD

## 2023-11-08 NOTE — Anesthesia Postprocedure Evaluation (Signed)
Anesthesia Post Note  Patient: Tynsley Milde  Procedure(s) Performed: RIGHT TOTAL HIP ARTHROPLASTY ANTERIOR APPROACH (Right: Hip)     Patient location during evaluation: PACU Anesthesia Type: Spinal Level of consciousness: awake, awake and alert and oriented Pain management: pain level controlled Vital Signs Assessment: post-procedure vital signs reviewed and stable Respiratory status: spontaneous breathing, nonlabored ventilation and respiratory function stable Cardiovascular status: blood pressure returned to baseline and stable Postop Assessment: no headache, no backache, spinal receding and no apparent nausea or vomiting Anesthetic complications: no   No notable events documented.  Last Vitals:  Vitals:   11/08/23 1226 11/08/23 1411  BP: 136/83 127/85  Pulse: 84 76  Resp: 17 17  Temp: 36.6 C 36.8 C  SpO2: 100% 100%    Last Pain:  Vitals:   11/08/23 1411  TempSrc: Oral  PainSc:                  Collene Schlichter

## 2023-11-08 NOTE — Op Note (Signed)
Operative Note  Date of operation: 11/08/2023 Preoperative diagnosis: Right hip primary osteoarthritis Postoperative diagnosis: Same  Procedure: Right direct anterior total hip arthroplasty  Implants: Implant Name Type Inv. Item Serial No. Manufacturer Lot No. LRB No. Used Action  CUP SECTOR GRIPTON - [id number] Cup CUP SECTOR GRIPTON  DEPUY [employer]TH[employer] [id number] Right 1 Implanted  LINER ACETABULAR 32X50 - [id number] Liner LINER ACETABULAR 32X50  DEPUY ORTHOPAEDICS [id number] Right 1 Implanted  STEM FEM ACTIS HIGH SZ3 - [id number] Stem STEM FEM ACTIS HIGH SZ3  DEPUY ORTHOPAEDICS [id number] Right 1 Implanted  HEAD FEM STD 32X+9 STRL - [id number] Hips HEAD FEM STD 32X+9 Jolayne Haines [employer] [id number] Right 1 Implanted   Surgeon: Vanita Panda. Magnus Ivan, MD Assistant: Rexene Edison, PA-C  Anesthesia: Spinal Antibiotics: IV Ancef EBL: 150 cc Complications: None  Indications: The patient is a 72 year old female with a Parkinson disease.  She has debilitating arthritis involving her right hip and her right knee.  Her right hip is hurt her significantly.  Her x-rays show bone-on-bone wear of that right hip.  At this point her right hip pain is definitely affecting her mobility, her quality of life and her actives daily living and she does wish to proceed with a hip replacement.  I have replaced her husband's hip earlier this year.  He has done well.  She understands there is definitely risks of acute blood loss anemia, nerve vessel injury, fracture, infection, DVT, dislocation, implant failure, leg length differences and wound healing issues.  She understands that our goals are hopefully decreased pain, improved mobility, and improved quality of life.  Procedure description: After informed consent was obtained and the appropriate right hip was marked, the patient was brought to the operating room and set up on the stretcher where spinal anesthesia was obtained.  She was then laid in supine  position and a Foley catheter was placed.  Traction boots were placed on both her feet and she was next placed supine on the Hana fracture table with a perineal post in place in both legs and inline skeletal traction devices no traction applied.  Her right operative hip and pelvis were assessed radiographically.  The right hip was prepped and draped in DuraPrep and sterile drapes.  A timeout was called and she was identified as the correct patient the correct right hip.  An incision was then made just inferior and posterior to the ASIS and carried slightly obliquely down the leg.  Dissection was carried down to the tensor fascia lata muscle and the tensor fascia was then divided longitudinally to proceed with a direct interposed the hip.  Circumflex vessels were identified and cauterized.  The hip capsule was identified and opened up in L-type format finding a moderate joint effusion.  Cobra retractors were placed around the medial and lateral femoral neck and a femoral neck cut was made with an oscillating saw just proximal to the lesser trochanter.  This cut was completed with an osteotome.  A corkscrew guide was placed in the femoral head and the femoral head was removed its entirety and there was a wide area devoid of cartilage and definitely deformity of the femoral head.  A bent Hohmann was then placed over the medial acetabular rim and remnants of the acetabular labrum and other debris removed.  Reaming was then initiated under direct visualization from a size 43 reamer going and stepwise increments up to a size 49 reamer with all reamers placed under direct visualization and the last reamer also  placed under direct fluoroscopy in order to obtain the depth and reaming, the inclination and the anteversion.  The real DePuy sector GRIPTION acetabular component size 50 was then placed without difficulty followed by a 32+0 polyethylene liner.  Attention was then turned to the femur.  With the right leg externally  rotated to 120 degrees, extended and adducted, a Mueller retractor was placed medially and Hohmann tract behind the greater trochanter.  The lateral joint capsule was released and a box cutting osteotome was used to enter the femoral canal.  Broaching was then initiated using the Actis broaching system from a size 0 going to a size 3.  With a size 3 in place we trialed a standard offset femoral neck and a 32+1 trial hip ball.  We reduce this in the acetabulum and it was definitely unstable.  She needed more leg length and offset.  We dislocated the hip remove the trial components.  We went with the real size 3 high offset Actis femoral component and went with the real 32+5 metal hip ball.  This was reduced in acetabulum and I still felt like there was a touch of instability so we dislocated that and removed the +5 metal head ball and went with a 32+9 metal hip ball.  We reduce this and asked out when I was pleased with leg length, offset, range of motion and stability assessed radiographically and clinically.  The soft tissue was then irrigated with normal saline solution.  The joint capsule was closed with interrupted #1 Ethibond suture followed by #1 Vicryl to close the tensor fascia.  0 Vicryl was used to close the deep tissue and 2-0 Vicryl was used to close subcutaneous tissue.  The skin was closed with staples.  An Aquacel dressing was applied.  The patient was taken off the Hana table and taken the recovery room.  Rexene Edison, PA-C did assist during the entire case and beginning to end and his assistance was crucial and medically necessary for soft tissue management and retraction, helping guide implant placement and a layered closure of the wound.

## 2023-11-08 NOTE — Care Plan (Signed)
Ortho Bundle Case Management Note  Patient Details  Name: Sarah Montoya MRN: [id number] Date of Birth: Nov 27, 1950  [facility] RNCM call to patient to discuss her upcoming Right total hip arthroplasty with Dr. Magnus Ivan on 11/08/23 at [facility]. CM and patient attempted multiple times prior to this date to discuss. She is agreeable to case management. She lives with her spouse and her plan is to return home with his assistance. She does have a RW and shower bench. No other DME is needed. Anticipate HHPT will be needed after a short hospital stay. Referral made to [facility] after choice provided. Reviewed all post op care instructions and questions answered. Will continue to follow for needs.                  DME Arranged:   (Patient reports having a RW and shower bench in home) DME Agency:     HH Arranged:  PT HH Agency:  [facility]  Additional Comments: Please contact me with any questions of if this plan should need to change.  Ralph Dowdy, RN, BSN, [employer]  [phone] 11/08/2023, 8:26 AM

## 2023-11-09 DIAGNOSIS — M1611 Unilateral primary osteoarthritis, right hip: Secondary | ICD-10-CM | POA: Diagnosis not present

## 2023-11-09 DIAGNOSIS — Z85828 Personal history of other malignant neoplasm of skin: Secondary | ICD-10-CM | POA: Diagnosis not present

## 2023-11-09 DIAGNOSIS — G20C Parkinsonism, unspecified: Secondary | ICD-10-CM | POA: Diagnosis not present

## 2023-11-09 DIAGNOSIS — E039 Hypothyroidism, unspecified: Secondary | ICD-10-CM | POA: Diagnosis not present

## 2023-11-09 DIAGNOSIS — Z79899 Other long term (current) drug therapy: Secondary | ICD-10-CM | POA: Diagnosis not present

## 2023-11-09 LAB — CBC
HCT: 33.7 % — ABNORMAL LOW (ref 36.0–46.0)
Hemoglobin: 11.2 g/dL — ABNORMAL LOW (ref 12.0–15.0)
MCH: 30.8 pg (ref 26.0–34.0)
MCHC: 33.2 g/dL (ref 30.0–36.0)
MCV: 92.6 fL (ref 80.0–100.0)
Platelets: 196 10*3/uL (ref 150–400)
RBC: 3.64 MIL/uL — ABNORMAL LOW (ref 3.87–5.11)
RDW: 13.3 % (ref 11.5–15.5)
WBC: 10.7 10*3/uL — ABNORMAL HIGH (ref 4.0–10.5)
nRBC: 0 % (ref 0.0–0.2)

## 2023-11-09 LAB — BASIC METABOLIC PANEL
Anion gap: 8 (ref 5–15)
BUN: 17 mg/dL (ref 8–23)
CO2: 24 mmol/L (ref 22–32)
Calcium: 8.8 mg/dL — ABNORMAL LOW (ref 8.9–10.3)
Chloride: 106 mmol/L (ref 98–111)
Creatinine, Ser: 0.64 mg/dL (ref 0.44–1.00)
GFR, Estimated: >60 mL/min (ref >60)
Glucose, Bld: 125 mg/dL — ABNORMAL HIGH (ref 70–99)
Potassium: 3.6 mmol/L (ref 3.5–5.1)
Sodium: 138 mmol/L (ref 135–145)

## 2023-11-09 MED ORDER — TRAMADOL HCL 50 MG PO TABS: 100.0000 mg | ORAL_TABLET | Freq: Four times a day (QID) | ORAL | 0 refills | Status: DC | PRN

## 2023-11-09 MED ORDER — ASPIRIN 81 MG PO CHEW: 81.0000 mg | CHEWABLE_TABLET | Freq: Two times a day (BID) | ORAL | 0 refills | Status: DC

## 2023-11-09 MED ORDER — METHOCARBAMOL 500 MG PO TABS: 500.0000 mg | ORAL_TABLET | Freq: Four times a day (QID) | ORAL | 0 refills | Status: DC | PRN

## 2023-11-09 MED ORDER — SUMATRIPTAN SUCCINATE 50 MG PO TABS
100.0000 mg | ORAL_TABLET | ORAL | Status: DC | PRN
  Administered 2023-11-09: 100 mg via ORAL
  Filled 2023-11-09 (×2): qty 2

## 2023-11-09 NOTE — Progress Notes (Signed)
Patient still remains confused.  Oriented only to self and husband.  Patient is asking for pain med for headache. But d/t the confusion this evening,  Patient's husband wants  her to take only tylenol for now till confusion is decreased.

## 2023-11-09 NOTE — Plan of Care (Signed)
  Problem: Activity: Goal: Risk for activity intolerance will decrease Outcome: Progressing   Problem: Pain Management: Goal: General experience of comfort will improve Outcome: Progressing   Problem: Safety: Goal: Ability to remain free from injury will improve Outcome: Progressing

## 2023-11-09 NOTE — Progress Notes (Signed)
Physical Therapy Treatment Patient Details Name: Sarah Montoya MRN: [id number] DOB: [birth year] Today's Date: 11/09/2023   History of Present Illness Pt s/p R THR and with hx of narcolepsy and Parkinsons    PT Comments  Pt in good spirits and with noted improvement in gait stability this am.  Pt up to ambulate increased distance in hall and up to bathroom for toileting and at sink for hand hygiene.  Will follow up and attempt stair training.    If plan is discharge home, recommend the following: A little help with walking and/or transfers;A little help with bathing/dressing/bathroom;Assistance with cooking/housework;Assist for transportation;Help with stairs or ramp for entrance   Can travel by private vehicle        Equipment Recommendations  None recommended by PT    Recommendations for Other Services       Precautions / Restrictions Precautions Precautions: Fall Restrictions Weight Bearing Restrictions Per Provider Order: No RLE Weight Bearing Per Provider Order: Weight bearing as tolerated     Mobility  Bed Mobility Overal bed mobility: Needs Assistance Bed Mobility: Supine to Sit     Supine to sit: Min assist     General bed mobility comments: Increased time with cues for sequence and use of L LE to self assist.    Transfers Overall transfer level: Needs assistance Equipment used: Rolling walker (2 wheels) Transfers: Sit to/from Stand Sit to Stand: Min assist, Contact guard assist           General transfer comment: Cues for LE management and use of UEs to self assist.  Physical assist to bring wt up and fwd and to balance in standing with RW    Ambulation/Gait Ambulation/Gait assistance: Min assist, Contact guard assist Gait Distance (Feet): 180 Feet (and 15' into bathroom) Assistive device: Rolling walker (2 wheels) Gait Pattern/deviations: Step-to pattern, Step-through pattern, Decreased step length - right, Decreased step length - left, Shuffle,  Trunk flexed Gait velocity: decr     General Gait Details: cues for posture, position from RW and sequence   Stairs             Wheelchair Mobility     Tilt Bed    Modified Rankin (Stroke Patients Only)       Balance Overall balance assessment: Needs assistance Sitting-balance support: No upper extremity supported, Feet supported Sitting balance-Leahy Scale: Good     Standing balance support: Single extremity supported Standing balance-Leahy Scale: Poor                              Cognition Arousal: Alert Behavior During Therapy: WFL for tasks assessed/performed Overall Cognitive Status: Within Functional Limits for tasks assessed                                          Exercises Total Joint Exercises Ankle Circles/Pumps: AROM, Both, 15 reps, Supine    General Comments        Pertinent Vitals/Pain Pain Assessment Pain Assessment: 0-10 Pain Score: 4  Pain Location: R hip Pain Descriptors / Indicators: Aching, Sore Pain Intervention(s): Limited activity within patient's tolerance    Home Living                          Prior Function  PT Goals (current goals can now be found in the care plan section) Acute Rehab PT Goals Patient Stated Goal: REgain IND PT Goal Formulation: With patient Time For Goal Achievement: 11/15/23 Potential to Achieve Goals: Good Progress towards PT goals: Progressing toward goals    Frequency    7X/week      PT Plan      Co-evaluation              AM-PAC PT "6 Clicks" Mobility   Outcome Measure  Help needed turning from your back to your side while in a flat bed without using bedrails?: A Little Help needed moving from lying on your back to sitting on the side of a flat bed without using bedrails?: A Little Help needed moving to and from a bed to a chair (including a wheelchair)?: A Little Help needed standing up from a chair using your arms (e.g.,  wheelchair or bedside chair)?: A Little Help needed to walk in hospital room?: A Little Help needed climbing 3-5 steps with a railing? : A Lot 6 Click Score: 17    End of Session Equipment Utilized During Treatment: Gait belt Activity Tolerance: Patient tolerated treatment well Patient left: in chair;with call bell/phone within reach;with chair alarm set;with family/visitor present Nurse Communication: Mobility status PT Visit Diagnosis: Unsteadiness on feet (R26.81);Difficulty in walking, not elsewhere classified (R26.2)     Time: 8413-2440 PT Time Calculation (min) (ACUTE ONLY): 33 min  Charges:    $Gait Training: 8-22 mins $Therapeutic Activity: 8-22 mins PT General Charges $$ ACUTE PT VISIT: 1 Visit                     Mauro Kaufmann PT Acute Rehabilitation Services Pager [phone] Office [phone]    Yeraldine Forney 11/09/2023, 1:01 PM

## 2023-11-09 NOTE — Discharge Instructions (Signed)

## 2023-11-09 NOTE — Care Management Obs Status (Signed)
MEDICARE OBSERVATION STATUS NOTIFICATION   Patient Details  Name: Sarah Montoya MRN: [id number] Date of Birth: December 29, 1950   Medicare Observation Status Notification Given:  Yes    Georgie Chard, LCSW 11/09/2023, 9:56 AM

## 2023-11-09 NOTE — Plan of Care (Signed)
  Problem: Education: Goal: Knowledge of General Education information will improve Description Including pain rating scale, medication(s)/side effects and non-pharmacologic comfort measures Outcome: Progressing   

## 2023-11-09 NOTE — Plan of Care (Signed)
  Problem: Education: Goal: Knowledge of General Education information will improve Description: Including pain rating scale, medication(s)/side effects and non-pharmacologic comfort measures Outcome: Adequate for Discharge   Problem: Health Behavior/Discharge Planning: Goal: Ability to manage health-related needs will improve Outcome: Adequate for Discharge   Problem: Clinical Measurements: Goal: Ability to maintain clinical measurements within normal limits will improve Outcome: Adequate for Discharge Goal: Will remain free from infection Outcome: Adequate for Discharge Goal: Diagnostic test results will improve Outcome: Adequate for Discharge Goal: Respiratory complications will improve Outcome: Adequate for Discharge Goal: Cardiovascular complication will be avoided Outcome: Adequate for Discharge   Problem: Activity: Goal: Risk for activity intolerance will decrease 11/09/2023 1508 by Alice Rieger A, LPN Outcome: Adequate for Discharge 11/09/2023 1109 by Alice Rieger A, LPN Outcome: Progressing   Problem: Nutrition: Goal: Adequate nutrition will be maintained Outcome: Adequate for Discharge   Problem: Coping: Goal: Level of anxiety will decrease Outcome: Adequate for Discharge   Problem: Elimination: Goal: Will not experience complications related to bowel motility Outcome: Adequate for Discharge Goal: Will not experience complications related to urinary retention Outcome: Adequate for Discharge   Problem: Pain Management: Goal: General experience of comfort will improve 11/09/2023 1508 by Delila Spence, LPN Outcome: Adequate for Discharge 11/09/2023 1109 by Alice Rieger A, LPN Outcome: Progressing   Problem: Safety: Goal: Ability to remain free from injury will improve 11/09/2023 1508 by Delila Spence, LPN Outcome: Adequate for Discharge 11/09/2023 1109 by Alice Rieger A, LPN Outcome: Progressing   Problem: Skin Integrity: Goal: Risk for  impaired skin integrity will decrease Outcome: Adequate for Discharge   Problem: Education: Goal: Knowledge of the prescribed therapeutic regimen will improve Outcome: Adequate for Discharge Goal: Understanding of discharge needs will improve Outcome: Adequate for Discharge Goal: Individualized Educational Video(s) Outcome: Adequate for Discharge   Problem: Activity: Goal: Ability to avoid complications of mobility impairment will improve Outcome: Adequate for Discharge Goal: Ability to tolerate increased activity will improve Outcome: Adequate for Discharge   Problem: Clinical Measurements: Goal: Postoperative complications will be avoided or minimized Outcome: Adequate for Discharge   Problem: Pain Management: Goal: Pain level will decrease with appropriate interventions Outcome: Adequate for Discharge   Problem: Skin Integrity: Goal: Will show signs of wound healing Outcome: Adequate for Discharge

## 2023-11-09 NOTE — Progress Notes (Signed)
Subjective: 1 Day Post-Op Procedure(s) (LRB): RIGHT TOTAL HIP ARTHROPLASTY ANTERIOR APPROACH (Right) Patient reports pain as moderate.  Has not been up with PT yet this am. Does report some thigh pain which is to be expected.  We had to decrease her from oxycodone yesterday because it was too strong.  She just has received some tramadol just recently so we will see how that works for her.  Objective: Vital signs in last 24 hours: Temp:  [97.6 F (36.4 C)-98.4 F (36.9 C)] 97.6 F (36.4 C) (12/21 1006) Pulse Rate:  [76-106] 84 (12/21 1006) Resp:  [15-17] 15 (12/21 1006) BP: (105-136)/(68-88) 105/68 (12/21 1006) SpO2:  [96 %-100 %] 99 % (12/21 1006)  Intake/Output from previous day: 12/20 0701 - 12/21 0700 In: 2333.9 [P.O.:680; I.V.:1373.5; IV Piggyback:280.4] Out: 1250 [Urine:1100; Blood:150] Intake/Output this shift: Total I/O In: -  Out: 50 [Urine:50]  Recent Labs    11/09/23 0738  HGB 11.2*   Recent Labs    11/09/23 0738  WBC 10.7*  RBC 3.64*  HCT 33.7*  PLT 196   Recent Labs    11/09/23 0738  NA 138  K 3.6  CL 106  CO2 24  BUN 17  CREATININE 0.64  GLUCOSE 125*  CALCIUM 8.8*   No results for input(s): "LABPT", "INR" in the last 72 hours.  Sensation intact distally Intact pulses distally Dorsiflexion/Plantar flexion intact Incision: scant drainage   Assessment/Plan: 1 Day Post-Op Procedure(s) (LRB): RIGHT TOTAL HIP ARTHROPLASTY ANTERIOR APPROACH (Right) Up with therapy  There is potential for discharge to home later this afternoon if she clears physical therapy.  Obviously she can stay another day if PT needs to be maximized and nursing feels comfortable with letting her go as well.    Kathryne Hitch 11/09/2023, 10:39 AM

## 2023-11-09 NOTE — Discharge Summary (Signed)
Patient ID: Sarah Montoya MRN: [id number] DOB/AGE: 1951/07/29 72 y.o.  Admit date: 11/08/2023 Discharge date: 11/09/2023  Admission Diagnoses:  Principal Problem:   Status post total replacement of right hip Active Problems:   Unilateral primary osteoarthritis, right hip   Discharge Diagnoses:  Same  Past Medical History:  Diagnosis Date   Arthritis    Basal cell carcinoma    Drowsiness    excessive daytime   Headache    Hx of migraines    Hypersomnia 08/26/2013   Hypothyroidism    Hypothyroidism 10/21/2014   Narcolepsy    Neuromuscular disorder (HCC)    Parkinson disease (HCC)     Surgeries: Procedure(s): RIGHT TOTAL HIP ARTHROPLASTY ANTERIOR APPROACH on 11/08/2023   Consultants:   Discharged Condition: Improved  Hospital Course: Sarah Montoya is an 72 y.o. female who was admitted 11/08/2023 for operative treatment ofStatus post total replacement of right hip. Patient has severe unremitting pain that affects sleep, daily activities, and work/hobbies. After pre-op clearance the patient was taken to the operating room on 11/08/2023 and underwent  Procedure(s): RIGHT TOTAL HIP ARTHROPLASTY ANTERIOR APPROACH.    Patient was given perioperative antibiotics:  Anti-infectives (From admission, onward)    Start     Dose/Rate Route Frequency Ordered Stop   11/08/23 1300  ceFAZolin (ANCEF) IVPB 1 g/50 mL premix        1 g 100 mL/hr over 30 Minutes Intravenous Every 6 hours 11/08/23 1008 11/08/23 1901   11/08/23 0600  ceFAZolin (ANCEF) IVPB 2g/100 mL premix        2 g 200 mL/hr over 30 Minutes Intravenous On call to O.R. 11/08/23 0535 11/08/23 0740        Patient was given sequential compression devices, early ambulation, and chemoprophylaxis to prevent DVT.  Patient benefited maximally from hospital stay and there were no complications.    Recent vital signs: Patient Vitals for the past 24 hrs:  BP Temp Temp src Pulse Resp SpO2  11/09/23 1323 99/61 98.1 F (36.7  C) -- 83 17 100 %  11/09/23 1006 105/68 97.6 F (36.4 C) Oral 84 15 99 %  11/09/23 0546 128/88 98.2 F (36.8 C) Oral (!) 106 15 96 %  11/09/23 0150 132/81 98.4 F (36.9 C) -- 99 16 99 %  11/08/23 2103 134/85 98.2 F (36.8 C) Oral 99 16 98 %     Recent laboratory studies:  Recent Labs    11/09/23 0738  WBC 10.7*  HGB 11.2*  HCT 33.7*  PLT 196  NA 138  K 3.6  CL 106  CO2 24  BUN 17  CREATININE 0.64  GLUCOSE 125*  CALCIUM 8.8*     Discharge Medications:   Allergies as of 11/09/2023       Reactions   Ciprofloxacin Diarrhea   Upset stomach   Codeine Nausea Only        Medication List     TAKE these medications    aspirin 81 MG chewable tablet Chew 1 tablet (81 mg total) by mouth 2 (two) times daily.   B-12 1000 MCG Caps Take 1,000 mcg by mouth once a week.   carbidopa-levodopa 25-100 MG tablet Commonly known as: SINEMET IR Take 1 tablet by mouth 3 (three) times daily. Must be seen for further refills. Call [phone] to schedule.   chlorhexidine 4 % external liquid Commonly known as: HIBICLENS Apply 15 mLs (1 Application total) topically as directed for 30 doses. Use as directed daily for 5 days every other week  for 6 weeks.   clobetasol ointment 0.05 % Commonly known as: TEMOVATE Apply bid for up to 7 days for itching   D3 High Potency 50 MCG (2000 UT) Caps Generic drug: Cholecalciferol Take 2,000 Units by mouth daily.   diclofenac Sodium 1 % Gel Commonly known as: VOLTAREN Apply 1 Application topically 3 (three) times daily as needed (joint pain).   estradiol 0.1 MG/GM vaginal cream Commonly known as: ESTRACE 1 gram vaginally up two twice weekly.   LACTAID PO Take 1 tablet by mouth as needed (lactose Intolerence).   levothyroxine 75 MCG tablet Commonly known as: SYNTHROID Take 75 mcg by mouth daily before breakfast.   meloxicam 7.5 MG tablet Commonly known as: MOBIC Take 7.5 mg by mouth daily.   methocarbamol 500 MG  tablet Commonly known as: ROBAXIN Take 1 tablet (500 mg total) by mouth every 6 (six) hours as needed for muscle spasms.   mometasone 0.1 % ointment Commonly known as: ELOCON Apply topically no more than twice weekly   mupirocin ointment 2 % Commonly known as: BACTROBAN Place 1 Application into the nose 2 (two) times daily for 60 doses. Use as directed 2 times daily for 5 days every other week for 6 weeks.   NONFORMULARY OR COMPOUNDED ITEM Topical testosterone cream 1mg / 0.47ml.  Apply 0.83ml topically two times weekly. #61month supply/1RF   rasagiline 1 MG Tabs tablet Commonly known as: AZILECT Take 1 tablet by mouth once daily   SUMAtriptan 100 MG tablet Commonly known as: IMITREX Take 100 mg by mouth every 2 (two) hours as needed for migraine.   traMADol 50 MG tablet Commonly known as: ULTRAM Take 2 tablets (100 mg total) by mouth every 6 (six) hours as needed for moderate pain (pain score 4-6).               Durable Medical Equipment  (From admission, onward)           Start     Ordered   11/08/23 1008  DME 3 n 1  Once        11/08/23 1008   11/08/23 1008  DME Walker rolling  Once       Question Answer Comment  Walker: With 5 Inch Wheels   Patient needs a walker to treat with the following condition Status post total replacement of right hip      11/08/23 1008            Diagnostic Studies: DG Pelvis Portable Result Date: 11/08/2023 CLINICAL DATA:  Status post right hip replacement. EXAM: PORTABLE PELVIS 1-2 VIEWS COMPARISON:  05/09/2023 FINDINGS: Status post right total hip replacement. Gas in the overlying soft tissues compatible with the immediate postoperative state. No evidence for immediate hardware complication. SI joints and symphysis pubis unremarkable. IMPRESSION: Status post right total hip replacement without complicating features. Electronically Signed   By: Kennith Center M.D.   On: 11/08/2023 12:52   DG HIP UNILAT WITH PELVIS 1V  RIGHT Result Date: 11/08/2023 CLINICAL DATA:  Intraoperative anterior right hip arthroplasty. Intraoperative fluoroscopy. EXAM: DG HIP (WITH OR WITHOUT PELVIS) 1V RIGHT COMPARISON:  Pelvis and right hip radiographs 10/02/2023 FINDINGS: Images were performed intraoperatively without the presence of a radiologist. Status post total right hip arthroplasty. No hardware complication is seen. Total fluoroscopy images: 3 Total fluoroscopy time: 14 seconds Total dose: Radiation Exposure Index (as provided by the fluoroscopic device): 1.24 mGy air Kerma Please see intraoperative findings for further detail. IMPRESSION: Intraoperative fluoroscopy for total right  hip arthroplasty. Electronically Signed   By: Neita Garnet M.D.   On: 11/08/2023 11:36   DG C-Arm 1-60 Min-No Report Result Date: 11/08/2023 Fluoroscopy was utilized by the requesting physician.  No radiographic interpretation.    Disposition: Discharge disposition: 01-Home or Self Care          Follow-up Information     Kathryne Hitch, MD. Go on 11/21/2023.   Specialty: Orthopedic Surgery Why: at 1:45 pm for your first in office visit with Dr. Kendrick Ranch information: [address] [place] [address] [phone]         [facility] Follow up.   Specialty: Home Health Services Why: to provide home physical therapy visits Contact information: [address] [place] [address] [phone]                  Signed: Kathryne Hitch 11/09/2023, 6:47 PM

## 2023-11-09 NOTE — Progress Notes (Signed)
Physical Therapy Treatment Patient Details Name: Sarah Montoya MRN: [id number] DOB: [birth year] Today's Date: 11/09/2023   History of Present Illness Pt s/p R THR and with hx of narcolepsy and Parkinsons    PT Comments  Pt continues cooperative but impulsive and progressing steadily with mobility.  Pt up to ambulate in hall, negotiated stairs, reviewed LE dressing and reviewed car transfers.  Pt and spouse comfortable with dc this date.    If plan is discharge home, recommend the following: A little help with walking and/or transfers;A little help with bathing/dressing/bathroom;Assistance with cooking/housework;Assist for transportation;Help with stairs or ramp for entrance   Can travel by private vehicle        Equipment Recommendations  None recommended by PT    Recommendations for Other Services       Precautions / Restrictions Precautions Precautions: Fall Restrictions Weight Bearing Restrictions Per Provider Order: No RLE Weight Bearing Per Provider Order: Weight bearing as tolerated     Mobility  Bed Mobility Overal bed mobility: Needs Assistance Bed Mobility: Supine to Sit     Supine to sit: Min assist     General bed mobility comments: Pt up in chair and returns to same    Transfers Overall transfer level: Needs assistance Equipment used: Rolling walker (2 wheels) Transfers: Sit to/from Stand Sit to Stand: Contact guard assist, Supervision           General transfer comment: Steady assist with cues for LE management and use of UEs to self assist    Ambulation/Gait Ambulation/Gait assistance: Contact guard assist Gait Distance (Feet): 100 Feet Assistive device: Rolling walker (2 wheels) Gait Pattern/deviations: Step-to pattern, Step-through pattern, Decreased step length - right, Decreased step length - left, Shuffle, Trunk flexed Gait velocity: decr     General Gait Details: cues for posture, position from RW and sequence   Stairs Stairs:  Yes Stairs assistance: Min assist Stair Management: One rail Right, Two rails, Step to pattern, Forwards, With cane Number of Stairs: 8 General stair comments: 5 stairs with bil rails and 3 stairs with rail and cane; cues for Armed forces training and education officer Wheelchair mobility: Yes   Tilt Bed    Modified Rankin (Stroke Patients Only)       Balance Overall balance assessment: Needs assistance Sitting-balance support: No upper extremity supported, Feet supported Sitting balance-Leahy Scale: Good     Standing balance support: Single extremity supported Standing balance-Leahy Scale: Poor                              Cognition Arousal: Alert Behavior During Therapy: WFL for tasks assessed/performed Overall Cognitive Status: Within Functional Limits for tasks assessed                                          Exercises Total Joint Exercises Ankle Circles/Pumps: AROM, Both, 15 reps, Supine    General Comments        Pertinent Vitals/Pain Pain Assessment Pain Assessment: 0-10 Pain Score: 4  Pain Location: R hip Pain Descriptors / Indicators: Aching, Sore Pain Intervention(s): Limited activity within patient's tolerance, Monitored during session, Premedicated before session, Ice applied    Home Living                          Prior Function  PT Goals (current goals can now be found in the care plan section) Acute Rehab PT Goals Patient Stated Goal: REgain IND PT Goal Formulation: With patient Time For Goal Achievement: 11/15/23 Potential to Achieve Goals: Good Progress towards PT goals: Progressing toward goals    Frequency    7X/week      PT Plan      Co-evaluation              AM-PAC PT "6 Clicks" Mobility   Outcome Measure  Help needed turning from your back to your side while in a flat bed without using bedrails?: A Little Help needed moving from lying on your back to  sitting on the side of a flat bed without using bedrails?: A Little Help needed moving to and from a bed to a chair (including a wheelchair)?: A Little Help needed standing up from a chair using your arms (e.g., wheelchair or bedside chair)?: A Little Help needed to walk in hospital room?: A Little Help needed climbing 3-5 steps with a railing? : A Little 6 Click Score: 18    End of Session Equipment Utilized During Treatment: Gait belt Activity Tolerance: Patient tolerated treatment well Patient left: in chair;with call bell/phone within reach;with chair alarm set;with family/visitor present Nurse Communication: Mobility status PT Visit Diagnosis: Unsteadiness on feet (R26.81);Difficulty in walking, not elsewhere classified (R26.2)     Time: 7829-5621 PT Time Calculation (min) (ACUTE ONLY): 22 min  Charges:    $Gait Training: 8-22 mins $Therapeutic Activity: 8-22 mins PT General Charges $$ ACUTE PT VISIT: 1 Visit                     Mauro Kaufmann PT Acute Rehabilitation Services Pager [phone] Office [phone]    Letroy Vazguez 11/09/2023, 3:01 PM

## 2023-11-10 ENCOUNTER — Telehealth: Payer: Self-pay | Admitting: *Deleted

## 2023-11-10 DIAGNOSIS — Z7982 Long term (current) use of aspirin: Secondary | ICD-10-CM | POA: Diagnosis not present

## 2023-11-10 DIAGNOSIS — G20A1 Parkinson's disease without dyskinesia, without mention of fluctuations: Secondary | ICD-10-CM | POA: Diagnosis not present

## 2023-11-10 DIAGNOSIS — G47419 Narcolepsy without cataplexy: Secondary | ICD-10-CM | POA: Diagnosis not present

## 2023-11-10 DIAGNOSIS — Z96641 Presence of right artificial hip joint: Secondary | ICD-10-CM | POA: Diagnosis not present

## 2023-11-10 DIAGNOSIS — Z9181 History of falling: Secondary | ICD-10-CM | POA: Diagnosis not present

## 2023-11-10 DIAGNOSIS — M7550 Bursitis of unspecified shoulder: Secondary | ICD-10-CM | POA: Diagnosis not present

## 2023-11-10 DIAGNOSIS — Z791 Long term (current) use of non-steroidal anti-inflammatories (NSAID): Secondary | ICD-10-CM | POA: Diagnosis not present

## 2023-11-10 DIAGNOSIS — M7750 Other enthesopathy of unspecified foot: Secondary | ICD-10-CM | POA: Diagnosis not present

## 2023-11-10 DIAGNOSIS — M21061 Valgus deformity, not elsewhere classified, right knee: Secondary | ICD-10-CM | POA: Diagnosis not present

## 2023-11-10 DIAGNOSIS — Z9841 Cataract extraction status, right eye: Secondary | ICD-10-CM | POA: Diagnosis not present

## 2023-11-10 DIAGNOSIS — M8588 Other specified disorders of bone density and structure, other site: Secondary | ICD-10-CM | POA: Diagnosis not present

## 2023-11-10 DIAGNOSIS — G43909 Migraine, unspecified, not intractable, without status migrainosus: Secondary | ICD-10-CM | POA: Diagnosis not present

## 2023-11-10 DIAGNOSIS — S83271D Complex tear of lateral meniscus, current injury, right knee, subsequent encounter: Secondary | ICD-10-CM | POA: Diagnosis not present

## 2023-11-10 DIAGNOSIS — Z471 Aftercare following joint replacement surgery: Secondary | ICD-10-CM | POA: Diagnosis not present

## 2023-11-10 DIAGNOSIS — I7300 Raynaud's syndrome without gangrene: Secondary | ICD-10-CM | POA: Diagnosis not present

## 2023-11-10 DIAGNOSIS — G4719 Other hypersomnia: Secondary | ICD-10-CM | POA: Diagnosis not present

## 2023-11-10 DIAGNOSIS — G5750 Tarsal tunnel syndrome, unspecified lower limb: Secondary | ICD-10-CM | POA: Diagnosis not present

## 2023-11-10 DIAGNOSIS — Z9842 Cataract extraction status, left eye: Secondary | ICD-10-CM | POA: Diagnosis not present

## 2023-11-10 DIAGNOSIS — E039 Hypothyroidism, unspecified: Secondary | ICD-10-CM | POA: Diagnosis not present

## 2023-11-10 NOTE — Telephone Encounter (Signed)
Patient discharged from hospital on Saturday after RTHA on 11/08/23. She and husband report they are doing well and HHPT has been to her home already. Reviewed medications. No new needs. Reminder to call with questions or needs.

## 2023-11-11 ENCOUNTER — Encounter (HOSPITAL_COMMUNITY): Payer: Self-pay | Admitting: Orthopaedic Surgery

## 2023-11-12 DIAGNOSIS — S83271D Complex tear of lateral meniscus, current injury, right knee, subsequent encounter: Secondary | ICD-10-CM | POA: Diagnosis not present

## 2023-11-12 DIAGNOSIS — Z471 Aftercare following joint replacement surgery: Secondary | ICD-10-CM | POA: Diagnosis not present

## 2023-11-12 DIAGNOSIS — M21061 Valgus deformity, not elsewhere classified, right knee: Secondary | ICD-10-CM | POA: Diagnosis not present

## 2023-11-12 DIAGNOSIS — E039 Hypothyroidism, unspecified: Secondary | ICD-10-CM | POA: Diagnosis not present

## 2023-11-12 DIAGNOSIS — G20A1 Parkinson's disease without dyskinesia, without mention of fluctuations: Secondary | ICD-10-CM | POA: Diagnosis not present

## 2023-11-12 DIAGNOSIS — I7300 Raynaud's syndrome without gangrene: Secondary | ICD-10-CM | POA: Diagnosis not present

## 2023-11-14 DIAGNOSIS — Z96641 Presence of right artificial hip joint: Secondary | ICD-10-CM | POA: Diagnosis not present

## 2023-11-14 DIAGNOSIS — T50905D Adverse effect of unspecified drugs, medicaments and biological substances, subsequent encounter: Secondary | ICD-10-CM | POA: Diagnosis not present

## 2023-11-15 DIAGNOSIS — G20A1 Parkinson's disease without dyskinesia, without mention of fluctuations: Secondary | ICD-10-CM | POA: Diagnosis not present

## 2023-11-15 DIAGNOSIS — S83271D Complex tear of lateral meniscus, current injury, right knee, subsequent encounter: Secondary | ICD-10-CM | POA: Diagnosis not present

## 2023-11-15 DIAGNOSIS — M21061 Valgus deformity, not elsewhere classified, right knee: Secondary | ICD-10-CM | POA: Diagnosis not present

## 2023-11-15 DIAGNOSIS — Z471 Aftercare following joint replacement surgery: Secondary | ICD-10-CM | POA: Diagnosis not present

## 2023-11-15 DIAGNOSIS — E039 Hypothyroidism, unspecified: Secondary | ICD-10-CM | POA: Diagnosis not present

## 2023-11-15 DIAGNOSIS — I7300 Raynaud's syndrome without gangrene: Secondary | ICD-10-CM | POA: Diagnosis not present

## 2023-11-17 ENCOUNTER — Other Ambulatory Visit: Payer: Self-pay | Admitting: Orthopaedic Surgery

## 2023-11-20 DIAGNOSIS — I7300 Raynaud's syndrome without gangrene: Secondary | ICD-10-CM | POA: Diagnosis not present

## 2023-11-20 DIAGNOSIS — Z471 Aftercare following joint replacement surgery: Secondary | ICD-10-CM | POA: Diagnosis not present

## 2023-11-20 DIAGNOSIS — S83271D Complex tear of lateral meniscus, current injury, right knee, subsequent encounter: Secondary | ICD-10-CM | POA: Diagnosis not present

## 2023-11-20 DIAGNOSIS — M21061 Valgus deformity, not elsewhere classified, right knee: Secondary | ICD-10-CM | POA: Diagnosis not present

## 2023-11-20 DIAGNOSIS — G20A1 Parkinson's disease without dyskinesia, without mention of fluctuations: Secondary | ICD-10-CM | POA: Diagnosis not present

## 2023-11-20 DIAGNOSIS — E039 Hypothyroidism, unspecified: Secondary | ICD-10-CM | POA: Diagnosis not present

## 2023-11-21 ENCOUNTER — Encounter: Payer: Self-pay | Admitting: Orthopaedic Surgery

## 2023-11-21 ENCOUNTER — Ambulatory Visit: Payer: Medicare Other | Admitting: Orthopaedic Surgery

## 2023-11-21 DIAGNOSIS — Z96641 Presence of right artificial hip joint: Secondary | ICD-10-CM

## 2023-11-21 NOTE — Progress Notes (Signed)
 The patient is here for first postoperative visit status post a right total hip replacement.  She does have Parkinson's disease which she deals with and has been dealing with the right knee that has arthritic changes.  The hip replacement is done well for her.  However she did have significant nightmares and problems sleeping on narcotics and would rather not be on any more of the codeine derivatives.  She has taken tramadol  and we did refill this recently.  She knows that she can still call for more refills when needed.  She has been compliant with a baby aspirin  twice daily as well ankle wearing compressive hose.  Her right hip incision looks great.  The staples have been removed and Steri-Strips applied.  Her leg lengths appear equal.  She will continue to increase her activities as comfort allows.  Will reevaluate her in 4 weeks to see how she is doing overall and we can also place a steroid injection in her right knee at that visit if needed.

## 2023-11-23 ENCOUNTER — Other Ambulatory Visit: Payer: Self-pay | Admitting: Orthopaedic Surgery

## 2023-11-23 DIAGNOSIS — M21061 Valgus deformity, not elsewhere classified, right knee: Secondary | ICD-10-CM | POA: Diagnosis not present

## 2023-11-23 DIAGNOSIS — Z471 Aftercare following joint replacement surgery: Secondary | ICD-10-CM | POA: Diagnosis not present

## 2023-11-23 DIAGNOSIS — S83271D Complex tear of lateral meniscus, current injury, right knee, subsequent encounter: Secondary | ICD-10-CM | POA: Diagnosis not present

## 2023-11-23 DIAGNOSIS — I7300 Raynaud's syndrome without gangrene: Secondary | ICD-10-CM | POA: Diagnosis not present

## 2023-11-23 DIAGNOSIS — E039 Hypothyroidism, unspecified: Secondary | ICD-10-CM | POA: Diagnosis not present

## 2023-11-23 DIAGNOSIS — G20A1 Parkinson's disease without dyskinesia, without mention of fluctuations: Secondary | ICD-10-CM | POA: Diagnosis not present

## 2023-11-29 ENCOUNTER — Other Ambulatory Visit: Payer: Self-pay | Admitting: Orthopaedic Surgery

## 2023-12-05 ENCOUNTER — Encounter: Payer: Medicare Other | Admitting: Orthopaedic Surgery

## 2023-12-19 ENCOUNTER — Ambulatory Visit: Payer: Medicare Other | Admitting: Orthopaedic Surgery

## 2023-12-19 ENCOUNTER — Encounter: Payer: Self-pay | Admitting: Orthopaedic Surgery

## 2023-12-19 DIAGNOSIS — Z96641 Presence of right artificial hip joint: Secondary | ICD-10-CM

## 2023-12-19 NOTE — Progress Notes (Signed)
The patient is here today at 6 weeks status post a right total hip replacement.  She says her right hip is doing great and she reports good range of motion and strength.  Even though her arthritis was so severe with her right hip, it really more so caused her right knee pain.  She says her knee is feeling better.  She is walking without assistive device.  She is a thin and active 73 year old female.  She says she is putting her shoes and socks on the easier as well.  She walks with a normal-appearing gait.  She lets me easily put her right hip through internal and external rotation.  She will continue to increase her activities as comfort allows.  Will see her back in 6 months unless there are issues before then.  At that visit we will have a standing AP pelvis and lateral of her right operative hip.

## 2024-01-01 ENCOUNTER — Other Ambulatory Visit: Payer: Self-pay | Admitting: Adult Health

## 2024-01-01 DIAGNOSIS — G20C Parkinsonism, unspecified: Secondary | ICD-10-CM

## 2024-01-06 DIAGNOSIS — Z85828 Personal history of other malignant neoplasm of skin: Secondary | ICD-10-CM | POA: Diagnosis not present

## 2024-01-06 DIAGNOSIS — C44311 Basal cell carcinoma of skin of nose: Secondary | ICD-10-CM | POA: Diagnosis not present

## 2024-01-27 ENCOUNTER — Other Ambulatory Visit: Payer: Self-pay | Admitting: Internal Medicine

## 2024-01-27 DIAGNOSIS — Z1231 Encounter for screening mammogram for malignant neoplasm of breast: Secondary | ICD-10-CM

## 2024-02-03 ENCOUNTER — Ambulatory Visit
Admission: RE | Admit: 2024-02-03 | Discharge: 2024-02-03 | Disposition: A | Discharge status: Home / Self Care | Source: Ambulatory Visit | Attending: Internal Medicine | Admitting: Internal Medicine

## 2024-02-03 DIAGNOSIS — Z1231 Encounter for screening mammogram for malignant neoplasm of breast: Secondary | ICD-10-CM

## 2024-02-07 ENCOUNTER — Encounter (HOSPITAL_BASED_OUTPATIENT_CLINIC_OR_DEPARTMENT_OTHER): Payer: Self-pay | Admitting: Obstetrics & Gynecology

## 2024-02-07 ENCOUNTER — Other Ambulatory Visit (HOSPITAL_BASED_OUTPATIENT_CLINIC_OR_DEPARTMENT_OTHER): Payer: Self-pay | Admitting: Obstetrics & Gynecology

## 2024-02-07 DIAGNOSIS — L900 Lichen sclerosus et atrophicus: Secondary | ICD-10-CM

## 2024-02-07 DIAGNOSIS — Z780 Asymptomatic menopausal state: Secondary | ICD-10-CM

## 2024-02-07 MED ORDER — MOMETASONE FUROATE 0.1 % EX OINT
TOPICAL_OINTMENT | CUTANEOUS | 1 refills | Status: DC
Start: 2024-02-07 — End: 2024-07-10

## 2024-02-07 MED ORDER — NONFORMULARY OR COMPOUNDED ITEM
1 refills | Status: DC
Start: 2024-02-07 — End: 2024-07-10

## 2024-02-07 MED ORDER — ESTRADIOL 0.1 MG/GM VA CREA
TOPICAL_CREAM | VAGINAL | 1 refills | Status: DC
Start: 2024-02-07 — End: 2024-07-10

## 2024-02-13 ENCOUNTER — Other Ambulatory Visit: Payer: Self-pay | Admitting: Adult Health

## 2024-02-13 DIAGNOSIS — G20C Parkinsonism, unspecified: Secondary | ICD-10-CM

## 2024-03-12 ENCOUNTER — Other Ambulatory Visit: Payer: Self-pay | Admitting: Adult Health

## 2024-03-12 DIAGNOSIS — G20C Parkinsonism, unspecified: Secondary | ICD-10-CM

## 2024-06-10 DIAGNOSIS — Z85828 Personal history of other malignant neoplasm of skin: Secondary | ICD-10-CM | POA: Diagnosis not present

## 2024-06-10 DIAGNOSIS — L718 Other rosacea: Secondary | ICD-10-CM | POA: Diagnosis not present

## 2024-06-10 DIAGNOSIS — C44319 Basal cell carcinoma of skin of other parts of face: Secondary | ICD-10-CM | POA: Diagnosis not present

## 2024-06-10 DIAGNOSIS — D1801 Hemangioma of skin and subcutaneous tissue: Secondary | ICD-10-CM | POA: Diagnosis not present

## 2024-06-10 DIAGNOSIS — C44311 Basal cell carcinoma of skin of nose: Secondary | ICD-10-CM | POA: Diagnosis not present

## 2024-06-10 DIAGNOSIS — L821 Other seborrheic keratosis: Secondary | ICD-10-CM | POA: Diagnosis not present

## 2024-06-17 ENCOUNTER — Ambulatory Visit: Payer: Medicare Other | Admitting: Orthopaedic Surgery

## 2024-06-30 DIAGNOSIS — G20A1 Parkinson's disease without dyskinesia, without mention of fluctuations: Secondary | ICD-10-CM | POA: Diagnosis not present

## 2024-07-10 ENCOUNTER — Encounter (HOSPITAL_BASED_OUTPATIENT_CLINIC_OR_DEPARTMENT_OTHER): Payer: Self-pay | Admitting: Obstetrics & Gynecology

## 2024-07-10 ENCOUNTER — Ambulatory Visit (HOSPITAL_BASED_OUTPATIENT_CLINIC_OR_DEPARTMENT_OTHER): Admitting: Obstetrics & Gynecology

## 2024-07-10 VITALS — BP 110/80 | HR 87 | Ht 65.0 in | Wt 125.0 lb

## 2024-07-10 DIAGNOSIS — M85852 Other specified disorders of bone density and structure, left thigh: Secondary | ICD-10-CM

## 2024-07-10 DIAGNOSIS — M8588 Other specified disorders of bone density and structure, other site: Secondary | ICD-10-CM

## 2024-07-10 DIAGNOSIS — M85851 Other specified disorders of bone density and structure, right thigh: Secondary | ICD-10-CM

## 2024-07-10 DIAGNOSIS — Z01411 Encounter for gynecological examination (general) (routine) with abnormal findings: Secondary | ICD-10-CM

## 2024-07-10 DIAGNOSIS — L900 Lichen sclerosus et atrophicus: Secondary | ICD-10-CM

## 2024-07-10 DIAGNOSIS — Z780 Asymptomatic menopausal state: Secondary | ICD-10-CM | POA: Diagnosis not present

## 2024-07-10 DIAGNOSIS — Z96641 Presence of right artificial hip joint: Secondary | ICD-10-CM

## 2024-07-10 MED ORDER — ESTRADIOL 0.1 MG/GM VA CREA: TOPICAL_CREAM | VAGINAL | 1 refills | Status: AC

## 2024-07-10 MED ORDER — NONFORMULARY OR COMPOUNDED ITEM: 1 refills | Status: AC

## 2024-07-10 MED ORDER — MOMETASONE FUROATE 0.1 % EX OINT: TOPICAL_OINTMENT | CUTANEOUS | 1 refills | Status: AC

## 2024-07-10 NOTE — Progress Notes (Signed)
 Breast and Pelvic Exam Patient name: Sarah Montoya MRN [id number]  Date of birth: May 09, 1951 Chief Complaint:   Breast and Pelvic Exam  History of Present Illness:   Sarah Montoya is a 73 y.o. G3P3 Caucasian female being seen today for breast and pelvic exam.  Biggest medical issue is neurological.  Followed at [facility] by Dr. Annabelle  Denies vaginal bleeding.  She does have some mild urinary incontinence.  She did go to physical therapy.    She did have a hip replacement last year in December and has done well since that surgery.  During this time and with recovery, stopped her vaginal estrogen and topical testosterone .  Would like to get started with both of these.  Also needs RF for mometasone  due to hx of lichen sclerosus.    Patient's last menstrual period was 07/03/2008.  Last pap 06/12/2021. Results were: normal. H/O abnormal pap: no Last mammogram: 02/03/24. Results were: normal. Family h/o breast cancer: yes mother Last colonoscopy: 02/14/2017. Results were: normal. Family h/o colorectal cancer: no DEXA:  T score -1.6.  she was high risk by FRAX score.       07/10/2024    8:24 AM 07/13/2021    4:54 PM 06/12/2021    1:46 PM 02/21/2017    2:03 PM 05/04/2015   12:02 PM  Depression screen PHQ 2/9  Decreased Interest 0 0 0 0 0  Down, Depressed, Hopeless 0 0 0 0 0  PHQ - 2 Score 0 0 0 0 0    Review of Systems:   Pertinent items are noted in HPI Denies any vaginal bleeding, urinary changes, bowel changes. Pertinent History Reviewed:  Reviewed past medical,surgical, social and family history.  Reviewed problem list, medications and allergies. Physical Assessment:   Vitals:   07/10/24 0818  BP: 110/80  Pulse: 87  SpO2: 99%  Weight: 125 lb (56.7 kg)  Height: 5' 5 (1.651 m)  Body mass index is 20.8 kg/m.        Physical Examination:   General appearance - well appearing, and in no distress  Mental status - alert, oriented to person, place, and time  Psych:  She has a  normal mood and affect  Skin - warm and dry, normal color, no suspicious lesions noted  Chest - effort normal, all lung fields clear to auscultation bilaterally  Heart - normal rate and regular rhythm  Neck:  midline trachea, no thyromegaly or nodules  Breasts - breasts appear normal, no suspicious masses, no skin or nipple changes or  axillary nodes  Abdomen - soft, nontender, nondistended, no masses or organomegaly  Pelvic - VULVA: normal appearing vulva with no masses, tenderness or lesions   VAGINA: normal appearing vagina with normal color and discharge, no lesions   CERVIX: normal appearing cervix without discharge or lesions, no CMT  Thin prep pap is not indicated  UTERUS: uterus is felt to be normal size, shape, consistency and nontender   ADNEXA: No adnexal masses or tenderness noted.  Rectal - normal rectal, good sphincter tone, no masses felt.   Extremities:  No swelling or varicosities noted  Chaperone present for exam  No results found for this or any previous visit (from the past 24 hours).  Assessment & Plan:  1. Encntr for gyn exam (general) (routine) w abnormal findings (Primary) - Pap smear 05/2021.  Not indicated due to guidelines. - Mammogram 02/03/2024 - Colonoscopy 02/14/2017 - Bone mineral density will be updated this nextyear - lab work done with  PCP, Dr. Dwight - vaccines reviewed/updated  2. Postmenopausal - estradiol  (ESTRACE ) 0.1 MG/GM vaginal cream; 1 gram vaginally up two twice weekly.  Dispense: 42.5 g; Refill: 1 - NONFORMULARY OR COMPOUNDED ITEM; Topical testosterone  cream 1mg / 0.60ml.  Apply 0.1ml topically two times weekly. #93month supply/1RF  Dispense: 1 each; Refill: 1  3. Lichen sclerosus - mometasone  (ELOCON ) 0.1 % ointment; Apply topically no more than twice weekly  Dispense: 45 g; Refill: 1  4. Osteopenia of both hips - DG Bone Density; Future  5.  Status post total replacement of right hip   Orders Placed This Encounter  Procedures   DG  Bone Density    Meds:  Meds ordered this encounter  Medications   estradiol  (ESTRACE ) 0.1 MG/GM vaginal cream    Sig: 1 gram vaginally up two twice weekly.    Dispense:  42.5 g    Refill:  1   mometasone  (ELOCON ) 0.1 % ointment    Sig: Apply topically no more than twice weekly    Dispense:  45 g    Refill:  1   NONFORMULARY OR COMPOUNDED ITEM    Sig: Topical testosterone  cream 1mg / 0.17ml.  Apply 0.1ml topically two times weekly. #72month supply/1RF    Dispense:  1 each    Refill:  1    Follow-up: Return in about 1 year (around 07/10/2025).  Ronal GORMAN Pinal, MD 07/15/2024 6:09 AM

## 2024-07-13 ENCOUNTER — Encounter: Payer: Self-pay | Admitting: Orthopaedic Surgery

## 2024-07-13 ENCOUNTER — Ambulatory Visit (INDEPENDENT_AMBULATORY_CARE_PROVIDER_SITE_OTHER): Admitting: Orthopaedic Surgery

## 2024-07-13 ENCOUNTER — Other Ambulatory Visit (INDEPENDENT_AMBULATORY_CARE_PROVIDER_SITE_OTHER): Payer: Self-pay

## 2024-07-13 DIAGNOSIS — Z96641 Presence of right artificial hip joint: Secondary | ICD-10-CM | POA: Diagnosis not present

## 2024-07-13 NOTE — Progress Notes (Signed)
 The patient is now over 8 months status post a right total hip arthroplasty to treat significant right hip pain and arthritis.  She says her knee pain on that side is resolved having the hip replaced.  She had severe end-stage bone-on-bone arthritis of the right hip.  She does have Parkinson disease and this does throw off her gait but overall she is very pleased with the hip replacement itself.  She reports good range of motion and strength.  She walks without an assistive device.  She is an active 73 year old female.    On exam her right operative hip moves smoothly and fluidly.  Her leg lengths appear equal.  Her right knee exam is normal.  Her left hip also did not move smoothly.  An AP pelvis and lateral right hip shows a well-seated right total hip arthroplasty with no complicating features.  At this point follow-up for her right hip can be as needed.  If there are any issues at all she knows to reach out and let us  know.  All questions and concerns were addressed and answered.

## 2024-08-27 DIAGNOSIS — Z23 Encounter for immunization: Secondary | ICD-10-CM | POA: Diagnosis not present

## 2024-09-21 ENCOUNTER — Encounter: Payer: Self-pay | Admitting: Radiology

## 2024-09-23 ENCOUNTER — Telehealth (HOSPITAL_BASED_OUTPATIENT_CLINIC_OR_DEPARTMENT_OTHER): Payer: Self-pay

## 2024-10-02 DIAGNOSIS — Z23 Encounter for immunization: Secondary | ICD-10-CM | POA: Diagnosis not present
